# Patient Record
Sex: Female | Born: 1995 | Race: White | Hispanic: No | Marital: Single | Smoking: Former smoker
Health system: Southern US, Community
[De-identification: ages and names within clinical notes are randomized; demographics above are authoritative.]

## PROBLEM LIST (undated history)

## (undated) DIAGNOSIS — T7840XA Allergy, unspecified, initial encounter: Secondary | ICD-10-CM

## (undated) DIAGNOSIS — F419 Anxiety disorder, unspecified: Secondary | ICD-10-CM

## (undated) DIAGNOSIS — L0591 Pilonidal cyst without abscess: Principal | ICD-10-CM

## (undated) DIAGNOSIS — R51 Headache: Secondary | ICD-10-CM

## (undated) HISTORY — DX: Allergy, unspecified, initial encounter: T78.40XA

## (undated) HISTORY — DX: Pilonidal cyst without abscess: L05.91

## (undated) HISTORY — DX: Anxiety disorder, unspecified: F41.9

## (undated) HISTORY — DX: Headache: R51

---
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
## 2014-02-14 ENCOUNTER — Emergency Department: Payer: Self-pay | Admitting: Emergency Medicine

## 2014-02-14 LAB — URINALYSIS, COMPLETE
BILIRUBIN, UR: NEGATIVE
Glucose,UR: NEGATIVE mg/dL (ref 0–75)
KETONE: NEGATIVE
LEUKOCYTE ESTERASE: NEGATIVE
Nitrite: NEGATIVE
PH: 5 (ref 4.5–8.0)
Specific Gravity: 1.03 (ref 1.003–1.030)
WBC UR: <1 /HPF (ref 0–5)

## 2014-02-14 LAB — GC/CHLAMYDIA PROBE AMP

## 2014-02-14 LAB — WET PREP, GENITAL

## 2014-05-02 ENCOUNTER — Ambulatory Visit: Payer: Self-pay | Admitting: Physician Assistant

## 2014-05-02 LAB — URINALYSIS, COMPLETE
BILIRUBIN, UR: NEGATIVE
GLUCOSE, UR: NEGATIVE
Ketone: NEGATIVE
LEUKOCYTE ESTERASE: NEGATIVE
NITRITE: NEGATIVE
PH: 5.5 (ref 5.0–8.0)
PROTEIN: NEGATIVE
Specific Gravity: >=1.03 (ref 1.000–1.030)
WBC UR: NONE SEEN /HPF (ref 0–5)

## 2014-05-02 LAB — PREGNANCY, URINE: Pregnancy Test, Urine: NEGATIVE m[IU]/mL

## 2015-02-07 ENCOUNTER — Encounter: Payer: Self-pay | Admitting: Family Medicine

## 2015-02-07 ENCOUNTER — Ambulatory Visit (INDEPENDENT_AMBULATORY_CARE_PROVIDER_SITE_OTHER): Payer: BLUE CROSS/BLUE SHIELD | Admitting: Family Medicine

## 2015-02-07 VITALS — BP 116/62 | HR 105 | Temp 98.1°F | Resp 18 | Ht 64.0 in | Wt 142.3 lb

## 2015-02-07 DIAGNOSIS — N911 Secondary amenorrhea: Secondary | ICD-10-CM | POA: Diagnosis not present

## 2015-02-07 DIAGNOSIS — L308 Other specified dermatitis: Secondary | ICD-10-CM | POA: Insufficient documentation

## 2015-02-07 DIAGNOSIS — Z87442 Personal history of urinary calculi: Secondary | ICD-10-CM | POA: Insufficient documentation

## 2015-02-07 DIAGNOSIS — Z3201 Encounter for pregnancy test, result positive: Secondary | ICD-10-CM

## 2015-02-07 DIAGNOSIS — Z3041 Encounter for surveillance of contraceptive pills: Secondary | ICD-10-CM | POA: Diagnosis not present

## 2015-02-07 DIAGNOSIS — Z8669 Personal history of other diseases of the nervous system and sense organs: Secondary | ICD-10-CM | POA: Insufficient documentation

## 2015-02-07 DIAGNOSIS — N920 Excessive and frequent menstruation with regular cycle: Secondary | ICD-10-CM | POA: Insufficient documentation

## 2015-02-07 DIAGNOSIS — R4586 Emotional lability: Secondary | ICD-10-CM | POA: Insufficient documentation

## 2015-02-07 NOTE — Progress Notes (Signed)
Name: Emma Edwards   MRN: [id number]    DOB: 15-Aug-1996   Date:02/07/2015       Progress Note  Subjective  Chief Complaint  Chief Complaint  Patient presents with  . Contraception    patient has had irregular periods since starting a new birth control.    HPI  Emma Edwards is a pleasant 19yo female with a know history of heavy menses with painful cycles. Started on OCPs 05/2014 which the brand was switched around recently. LMP 01/06/15. Is currently sexually active. No abnormal pelvic pain or spotting today. No vaginal discharge.   Patient Active Problem List   Diagnosis Date Noted  . 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 Surveillance for birth control, oral contraceptives 02/07/2015  . History of kidney stones 02/07/2015  . History of migraine headaches 02/07/2015  . Menorrhagia with regular cycle 02/07/2015  . Anxiety and depression 02/07/2015    History  Substance Use Topics  . Smoking status: Current Every Day Smoker -- 1.00 packs/day for 2 years  . Smokeless tobacco: Never Used  . Alcohol Use: No     Current outpatient prescriptions:  .  Prenat w/o A-FeCb-FeGl-DSS-FA ([facility] RX PO), Take by mouth., Disp: , Rfl:  .  propranolol (INDERAL) 10 MG tablet, Take 10 mg by mouth 3 (three) times daily., Disp: , Rfl:  .  SUMAtriptan (IMITREX) 25 MG tablet, Take 25 mg by mouth as needed for migraine., Disp: , Rfl:   History reviewed. No pertinent past surgical history.  Family History  Problem Relation Age of Onset  . Alcohol abuse Mother   . Depression Mother   . Drug abuse Mother   . Mental illness Mother   . Alcohol abuse Father   . Arthritis Father   . Depression Father   . Drug abuse Father   . Hypertension Father   . Hypertension Paternal Uncle   . Alcohol abuse Maternal Grandmother   . Depression Maternal Grandmother   . Drug abuse Maternal Grandmother   . Mental illness Maternal Grandmother   . Alcohol abuse Maternal Grandfather   . Drug abuse Maternal  Grandfather   . Arthritis Paternal Grandmother   . Asthma Paternal Grandmother   . Cancer Paternal Grandmother   . COPD Paternal Grandmother   . Hyperlipidemia Paternal Grandmother     Allergies  Allergen Reactions  . Penicillins Hives     Review of Systems  CONSTITUTIONAL: No significant weight changes, fever, chills, weakness or fatigue.  HEENT:  - Eyes: No visual changes.  - Ears: No auditory changes. No pain.  - Nose: No sneezing, congestion, runny nose. - Throat: No sore throat. No changes in swallowing. SKIN: No rash or itching.  CARDIOVASCULAR: No chest pain, chest pressure or chest discomfort. No palpitations or edema.  RESPIRATORY: No shortness of breath, cough or sputum.  GASTROINTESTINAL: No anorexia, nausea, vomiting. No changes in bowel habits. No abdominal pain or blood.  GENITOURINARY: No dysuria. No frequency. No discharge. Having irregular menses. NEUROLOGICAL: No headache, dizziness, syncope, paralysis, ataxia, numbness or tingling in the extremities. No memory changes. No change in bowel or bladder control.  MUSCULOSKELETAL: No joint pain. No muscle pain. HEMATOLOGIC: No anemia, bleeding or bruising.  LYMPHATICS: No enlarged lymph nodes.  PSYCHIATRIC: No change in mood. No change in sleep pattern.  ENDOCRINOLOGIC: No reports of sweating, cold or heat intolerance. No polyuria or polydipsia.       Objective  BP 116/62 mmHg  Pulse 105  Temp(Src) 98.1 F (36.7 C) (  Oral)  Resp 18  Ht  (1.626 m)  Wt 142 lb 4.8 oz (64.547 kg)  BMI 24.41 kg/m2  SpO2 98%  LMP 01/07/2015 (Approximate)  Physical Exam  Constitutional: Patient appears well-developed and well-nourished. In no distress.  HEENT:  - Head: Normocephalic and atraumatic.  - Ears: Bilateral TMs gray, no erythema or effusion - Nose: Nasal mucosa moist - Mouth/Throat: Oropharynx is clear and moist. No tonsillar hypertrophy or erythema. No post nasal drainage.  - Eyes: Conjunctivae clear,  EOM movements normal. PERRLA. No scleral icterus.  Neck: Normal range of motion. Neck supple. No JVD present. No thyromegaly present.  Cardiovascular: Normal rate, regular rhythm and normal heart sounds.  No murmur heard.  Pulmonary/Chest: Effort normal and breath sounds normal. No respiratory distress. Musculoskeletal: Normal range of motion bilateral UE and LE, no joint effusions. Peripheral vascular: Bilateral LE no edema. Neurological: CN II-XII grossly intact with no focal deficits. Alert and oriented to person, place, and time. Coordination, balance, strength, speech and gait are normal.  Skin: Skin is warm and dry. Scattered cystic acne on shoulders chest and face. 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 Psychiatric: Patient has a normal mood and affect. Behavior is normal in office today. Judgment and thought content normal in office today.   Assessment & Plan  1. Surveillance for birth control, oral contraceptives If pregnancy not confirmed on blood work she is interested in trying Ortho-Tri Cyclen again which has worked well for her in the past.  2. Positive urine pregnancy test Get serum BHCG quant. Instructed patient to avoid NSAIDs and start PNV with Folate.  - B-HCG Quant  3. Secondary amenorrhea  - POCT urine pregnancy - B-HCG Quant

## 2015-02-08 LAB — BETA HCG QUANT (REF LAB): hCG Quant: 111 m[IU]/mL

## 2015-02-12 ENCOUNTER — Ambulatory Visit (INDEPENDENT_AMBULATORY_CARE_PROVIDER_SITE_OTHER): Payer: BLUE CROSS/BLUE SHIELD | Admitting: Family Medicine

## 2015-02-12 ENCOUNTER — Emergency Department
Admission: EM | Admit: 2015-02-12 | Discharge: 2015-02-13 | Disposition: A | Discharge status: Home / Self Care | Payer: BLUE CROSS/BLUE SHIELD | Attending: Emergency Medicine | Admitting: Emergency Medicine

## 2015-02-12 ENCOUNTER — Encounter: Payer: Self-pay | Admitting: Urgent Care

## 2015-02-12 ENCOUNTER — Encounter: Payer: Self-pay | Admitting: Family Medicine

## 2015-02-12 ENCOUNTER — Ambulatory Visit
Admission: RE | Admit: 2015-02-12 | Discharge: 2015-02-12 | Disposition: A | Discharge status: Home / Self Care | Payer: BLUE CROSS/BLUE SHIELD | Source: Ambulatory Visit | Attending: Family Medicine | Admitting: Family Medicine

## 2015-02-12 ENCOUNTER — Other Ambulatory Visit: Payer: Self-pay | Admitting: Family Medicine

## 2015-02-12 VITALS — BP 112/76 | HR 95 | Temp 98.0°F | Resp 16 | Ht 64.0 in | Wt 141.3 lb

## 2015-02-12 DIAGNOSIS — O209 Hemorrhage in early pregnancy, unspecified: Secondary | ICD-10-CM

## 2015-02-12 DIAGNOSIS — O200 Threatened abortion: Secondary | ICD-10-CM | POA: Diagnosis not present

## 2015-02-12 DIAGNOSIS — Z3A01 Less than 8 weeks gestation of pregnancy: Secondary | ICD-10-CM | POA: Insufficient documentation

## 2015-02-12 DIAGNOSIS — Z87891 Personal history of nicotine dependence: Secondary | ICD-10-CM | POA: Diagnosis not present

## 2015-02-12 DIAGNOSIS — Z79899 Other long term (current) drug therapy: Secondary | ICD-10-CM | POA: Diagnosis not present

## 2015-02-12 DIAGNOSIS — O4691 Antepartum hemorrhage, unspecified, first trimester: Secondary | ICD-10-CM | POA: Diagnosis not present

## 2015-02-12 DIAGNOSIS — Z880 Allergy status to penicillin: Secondary | ICD-10-CM | POA: Diagnosis not present

## 2015-02-12 LAB — HCG, QUANTITATIVE, PREGNANCY: HCG, BETA CHAIN, QUANT, S: 21 m[IU]/mL — AB (ref <5)

## 2015-02-12 NOTE — ED Notes (Signed)
Patient presents with c/o moderate to heavy vaginal bleeding since 0800 - reports that she has used 6-8 pads; 2 in the last hour. Of note, patient is reports that is a 4 week primipara.

## 2015-02-12 NOTE — Progress Notes (Deleted)
Name: Emma Edwards   MRN: [id number]    DOB: 01/14/1996   Date:02/12/2015       Progress Note  Subjective  Chief Complaint  No chief complaint on file.   HPI  Emma Edwards is a pleasant 18yo female G1P0 pregnancy at about 4-5weeks based on serum BHCG levels done last week who presents today with vaginal bleeding.   Patient Active Problem List   Diagnosis Date Noted  . 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 Surveillance for birth control, oral contraceptives 02/07/2015  . History of kidney stones 02/07/2015  . History of migraine headaches 02/07/2015  . Menorrhagia with regular cycle 02/07/2015  . Anxiety and depression 02/07/2015    History  Substance Use Topics  . Smoking status: Current Every Day Smoker -- 1.00 packs/day for 2 years  . Smokeless tobacco: Never Used  . Alcohol Use: No     Current outpatient prescriptions:  .  Prenat w/o A-FeCb-FeGl-DSS-FA ([facility] RX PO), Take by mouth., Disp: , Rfl:  .  propranolol (INDERAL) 10 MG tablet, Take 10 mg by mouth 3 (three) times daily., Disp: , Rfl:  .  SUMAtriptan (IMITREX) 25 MG tablet, Take 25 mg by mouth as needed for migraine., Disp: , Rfl:   Allergies  Allergen Reactions  . Penicillins Hives    Review of Systems  Ten systems reviewed and is negative except as mentioned in HPI.

## 2015-02-12 NOTE — Patient Instructions (Signed)
Vaginal Bleeding During Pregnancy, First Trimester  A small amount of bleeding (spotting) from the vagina is relatively common in early pregnancy. It usually stops on its own. Various things may cause bleeding or spotting in early pregnancy. Some bleeding may be related to the pregnancy, and some may not. In most cases, the bleeding is normal and is not a problem. However, bleeding can also be a sign of something serious. Be sure to tell your health care provider about any vaginal bleeding right away.  Some possible causes of vaginal bleeding during the first trimester include:  · Infection or inflammation of the cervix.  · Growths (polyps) on the cervix.  · Miscarriage or threatened miscarriage.  · Pregnancy tissue has developed outside of the uterus and in a fallopian tube (tubal pregnancy).  · Tiny cysts have developed in the uterus instead of pregnancy tissue (molar pregnancy).  HOME CARE INSTRUCTIONS   Watch your condition for any changes. The following actions may help to lessen any discomfort you are feeling:  · Follow your health care provider's instructions for limiting your activity. If your health care provider orders bed rest, you may need to stay in bed and only get up to use the bathroom. However, your health care provider may allow you to continue light activity.  · If needed, make plans for someone to help with your regular activities and responsibilities while you are on bed rest.  · Keep track of the number of pads you use each day, how often you change pads, and how soaked (saturated) they are. Write this down.  · Do not use tampons. Do not douche.  · Do not have sexual intercourse or orgasms until approved by your health care provider.  · If you pass any tissue from your vagina, save the tissue so you can show it to your health care provider.  · Only take over-the-counter or prescription medicines as directed by your health care provider.  · Do not take aspirin because it can make you  bleed.  · Keep all follow-up appointments as directed by your health care provider.  SEEK MEDICAL CARE IF:  · You have any vaginal bleeding during any part of your pregnancy.  · You have cramps or labor pains.  · You have a fever, not controlled by medicine.  SEEK IMMEDIATE MEDICAL CARE IF:   · You have severe cramps in your back or belly (abdomen).  · You pass large clots or tissue from your vagina.  · Your bleeding increases.  · You feel light-headed or weak, or you have fainting episodes.  · You have chills.  · You are leaking fluid or have a gush of fluid from your vagina.  · You pass out while having a bowel movement.  MAKE SURE YOU:  · Understand these instructions.  · Will watch your condition.  · Will get help right away if you are not doing well or get worse.  Document Released: 05/20/2005 Document Revised: 08/15/2013 Document Reviewed: 04/17/2013  [employer]® Patient Information ©2015 [employer]. This information is not intended to replace advice given to you by your health care provider. Make sure you discuss any questions you have with your health care provider.

## 2015-02-12 NOTE — ED Provider Notes (Signed)
[facility] Emergency Department Provider Note  ____________________________________________  Time seen: Approximately 11:26 PM  I have reviewed the triage vital signs and the nursing notes.   HISTORY  Chief Complaint Vaginal Bleeding    HPI Emma Edwards is a 19 y.o. female who presents with vaginal bleeding since 8 AM. Patient is a G1 P0 approximately [redacted] weeks pregnant who was seen by her PCP this afternoon for vaginal bleeding. Patient had a beta hCG and transvaginal ultrasound performed. Last sexual intercourse the previous evening. Patient awoke this morning to find vaginal spotting which became moderate bleeding by this evening. Bleeding is associated with 6/10 pelvic cramping. Patient denies feeling lightheaded, chest pain, shortness of breath, or vomiting.   Past Medical History  Diagnosis Date  . Pilonidal cyst 2014  . Headache(784.0) 2013  . Allergy   . Anxiety   No history of ovarian cysts or endometriosis  Patient Active Problem List   Diagnosis Date Noted  . 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 Surveillance for birth control, oral contraceptives 02/07/2015  . History of kidney stones 02/07/2015  . History of migraine headaches 02/07/2015  . Menorrhagia with regular cycle 02/07/2015  . Anxiety and depression 02/07/2015    History reviewed. No pertinent past surgical history.  Current Outpatient Rx  Name  Route  Sig  Dispense  Refill  . Prenat w/o A-FeCb-FeGl-DSS-FA ([facility] RX PO)   Oral   Take by mouth.         . propranolol (INDERAL) 10 MG tablet   Oral   Take 10 mg by mouth 3 (three) times daily.         . SUMAtriptan (IMITREX) 25 MG tablet   Oral   Take 25 mg by mouth as needed for migraine.           Allergies Penicillins  Family History  Problem Relation Age of Onset  . Alcohol abuse Mother   . Depression Mother   . Drug abuse Mother   . Mental illness Mother   . Alcohol abuse Father   . Arthritis Father    . Depression Father   . Drug abuse Father   . Hypertension Father   . Hypertension Paternal Uncle   . Alcohol abuse Maternal Grandmother   . Depression Maternal Grandmother   . Drug abuse Maternal Grandmother   . Mental illness Maternal Grandmother   . Alcohol abuse Maternal Grandfather   . Drug abuse Maternal Grandfather   . Arthritis Paternal Grandmother   . Asthma Paternal Grandmother   . Cancer Paternal Grandmother   . COPD Paternal Grandmother   . Hyperlipidemia Paternal Grandmother     Social History History  Substance Use Topics  . Smoking status: Former Smoker -- 1.00 packs/day for 2 years  . Smokeless tobacco: Never Used  . Alcohol Use: No    Review of Systems Constitutional: No fever/chills Eyes: No visual changes. ENT: No sore throat. Cardiovascular: Denies chest pain. Respiratory: Denies shortness of breath. Gastrointestinal: No abdominal pain.  No nausea, no vomiting.  No diarrhea.  No constipation. Genitourinary: Positive for vaginal bleeding. Negative for dysuria. Musculoskeletal: Negative for back pain. Skin: Negative for rash. Neurological: Negative for headaches, focal weakness or numbness.  10-point ROS otherwise negative.  ____________________________________________   PHYSICAL EXAM:  VITAL SIGNS: ED Triage Vitals  Enc Vitals Group     BP 02/12/15 2240 127/87 mmHg     Pulse Rate 02/12/15 2240 80     Resp 02/12/15 2240 14  Temp 02/12/15 2240 98.5 F (36.9 C)     Temp Source 02/12/15 2240 Oral     SpO2 02/12/15 2240 98 %     Weight 02/12/15 2240 142 lb (64.411 kg)     Height 02/12/15 2240 5\' 4"  (1.626 m)     Head Cir --      Peak Flow --      Pain Score 02/12/15 2241 5     Pain Loc --      Pain Edu? --      Excl. in GC? --     Constitutional: Alert and oriented. Well appearing and in no acute distress. Eyes: Conjunctivae are normal. PERRL. EOMI. Head: Atraumatic. Nose: No congestion/rhinnorhea. Mouth/Throat: Mucous membranes  are moist.  Oropharynx non-erythematous. Neck: No stridor.   Cardiovascular: Normal rate, regular rhythm. Grossly normal heart sounds.  Good peripheral circulation. Respiratory: Normal respiratory effort.  No retractions. Lungs CTAB. Gastrointestinal: Soft and nontender. No distention. No abdominal bruits. No CVA tenderness. Pelvic: External exam within normal limits and free of rash or lesions. Speculum exam reveals mild to moderate vaginal bleeding with closed cervical os. Bimanual exam within normal limits. Musculoskeletal: No lower extremity tenderness nor edema.  No joint effusions. Neurologic:  Normal speech and language. No gross focal neurologic deficits are appreciated. Speech is normal. No gait instability. Skin:  Skin is warm, dry and intact. No rash noted. Psychiatric: Mood and affect are normal. Speech and behavior are normal.  ____________________________________________   LABS (all labs ordered are listed, but only abnormal results are displayed)  Labs Reviewed  CBC WITH DIFFERENTIAL/PLATELET - Abnormal; Notable for the following:    Lymphs Abs 4.7 (*)    All other components within normal limits  ABO/RH  ABO/RH   ____________________________________________  EKG  None ____________________________________________  RADIOLOGY  Pelvic ultrasound less than 14 weeks with transvaginal (viewed by me, interpreted by Dr. Earlene Plater): No intrauterine gestational sac identified. In the setting of positive pregnancy test and no definite intrauterine pregnancy, this reflects a pregnancy of unknown location. Differential considerations include early normal IUP, abnormal IUP, or nonvisualized ectopic pregnancy. Differentiation is achieved with serial beta HCG supplemented by repeat sonography as clinically warranted. ____________________________________________   PROCEDURES  Procedure(s) performed: None  Critical Care performed:  No  ____________________________________________   INITIAL IMPRESSION / ASSESSMENT AND PLAN / ED COURSE  Pertinent labs & imaging results that were available during my care of the patient were reviewed by me and considered in my medical decision making (see chart for details).  19 year old G1 P0 approximately [redacted] weeks pregnant who presents with moderate vaginal bleeding. Beta hCG has dropped from 111 on June 16 to 21 on labs done this afternoon. Patient does not know her blood type. We will check a CBC and Rh; will attempt to find ultrasound results from this afternoon.  ----------------------------------------- 1:22 AM on 02/13/2015 -----------------------------------------  Discussed with patient results of ultrasound. Patient will follow-up with her doctor this week. Strict return precautions given. Patient verbalizes understanding and agrees with plan of care. ____________________________________________   FINAL CLINICAL IMPRESSION(S) / ED DIAGNOSES  Final diagnoses:  Threatened miscarriage in early pregnancy      Irean Hong, MD 02/13/15 [phone]

## 2015-02-12 NOTE — Progress Notes (Deleted)
Name: Emma Edwards   MRN: [id number]    DOB: 05/30/1996   Date:02/12/2015       Progress Note  Subjective  Chief Complaint  No chief complaint on file.   HPI  Emma Edwards is a pleasant 18yo female G1P0 pregnancy at about 4-5weeks based on serum BHCG levels done last week who presents today with vaginal bleeding.   Patient Active Problem List   Diagnosis Date Noted  . 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 Surveillance for birth control, oral contraceptives 02/07/2015  . History of kidney stones 02/07/2015  . History of migraine headaches 02/07/2015  . Menorrhagia with regular cycle 02/07/2015  . Anxiety and depression 02/07/2015    History  Substance Use Topics  . Smoking status: Current Every Day Smoker -- 1.00 packs/day for 2 years  . Smokeless tobacco: Never Used  . Alcohol Use: No     Current outpatient prescriptions:  .  Prenat w/o A-FeCb-FeGl-DSS-FA ([facility] RX PO), Take by mouth., Disp: , Rfl:  .  propranolol (INDERAL) 10 MG tablet, Take 10 mg by mouth 3 (three) times daily., Disp: , Rfl:  .  SUMAtriptan (IMITREX) 25 MG tablet, Take 25 mg by mouth as needed for migraine., Disp: , Rfl:   Allergies  Allergen Reactions  . Penicillins Hives    Review of Systems  Ten systems reviewed and is negative except as mentioned in HPI.     

## 2015-02-12 NOTE — Progress Notes (Signed)
Name: Emma Edwards   MRN: [id number]    DOB: [birth year]   Date:02/12/2015       Progress Note  Subjective  Chief Complaint  Chief Complaint  Patient presents with  . Vaginal Bleeding    Pt is [redacted]wks pregnant old blood as of today blood is bright red    HPI  Emma Edwards is a pleasant 19yo female G1P0 pregnancy at about 4-5weeks based on serum BHCG levels done last week who presents today with vaginal bleeding. No tissue or blood clots passed. Some pelvic pain but not intolerable. No fevers, chills, nausea.    Patient Active Problem List   Diagnosis Date Noted  . 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 Surveillance for birth control, oral contraceptives 02/07/2015  . History of kidney stones 02/07/2015  . History of migraine headaches 02/07/2015  . Menorrhagia with regular cycle 02/07/2015  . Anxiety and depression 02/07/2015    History  Substance Use Topics  . Smoking status: Former Smoker -- 1.00 packs/day for 2 years  . Smokeless tobacco: Never Used  . Alcohol Use: No     Current outpatient prescriptions:  .  Prenat w/o A-FeCb-FeGl-DSS-FA ([facility]I RX PO), Take by mouth., Disp: , Rfl:  .  propranolol (INDERAL) 10 MG tablet, Take 10 mg by mouth 3 (three) times daily., Disp: , Rfl:  .  SUMAtriptan (IMITREX) 25 MG tablet, Take 25 mg by mouth as needed for migraine., Disp: , Rfl:   Allergies  Allergen Reactions  . Penicillins Hives    Review of Systems  Ten systems reviewed and is negative except as mentioned in HPI.    Objective  BP 112/76 mmHg  Pulse 95  Temp(Src) 98 F (36.7 C)  Resp 16  Ht 5\' 4"  (1.626 m)  Wt 141 lb 4.8 oz (64.093 kg)  BMI 24.24 kg/m2  SpO2 97%  LMP 01/07/2015 (Approximate)  Physical Exam  Constitutional: Patient appears well-developed and well-nourished. In no distress.  Cardiovascular: Normal rate, regular rhythm and normal heart sounds.  No murmur heard.  Pulmonary/Chest: Effort normal and breath sounds normal. No respiratory  distress. Abdominal: Soft. Bowel sounds are normal, no distension. There is no tenderness. no masses FEMALE GENITALIA:  External genitalia normal External urethra normal Vaginal vault normal with fresh dark red blood, no tissue or clotts. Cervical os open Bimanual exam not done Peripheral vascular: Bilateral LE no edema. Skin: Skin is warm and dry. No rash noted. No erythema.  Psychiatric: Patient is anxious. Behavior is normal in office today. Judgment and thought content normal in office today.   Recent Results (from the past 2160 hour(s))  Beta HCG, Quant (tumor marker)     Status: None   Collection Time: 02/07/15  3:12 PM  Result Value Ref Range   hCG Quant 111 mIU/mL    Comment:                      Female (Non-pregnant)    0 -     5                             (Postmenopausal)  0 -     8                      Female (Pregnant)                      Weeks of Gestation  3                6 -    71                              4               10 -   750                              5              217 -  7138                              6              158 - 31795                              7             3697 -163563                              8            32065 -149571                              9            63803 -151410                             10            46509 -186977                             12            27832 -210612                             14            13950 - 62530                             15            12039 - 70971                             16             9040 - 56451                             17             8175 - 55868                             18             8099 - 58176 [employer] E CLIA methodology  Assessment & Plan  1. Vaginal bleeding in pregnancy, first trimester Threatened abortion discussed with patient. She voices understanding what is discussed today. I will have her repeat serum Bhcg and OB  ultrasound today.  - hCG, quantitative, pregnancy - [place] OP OB Comp Less 14 Wks; Future - US OB Transvaginal; Future

## 2015-02-13 ENCOUNTER — Telehealth: Payer: Self-pay | Admitting: Family Medicine

## 2015-02-13 DIAGNOSIS — O200 Threatened abortion: Secondary | ICD-10-CM | POA: Insufficient documentation

## 2015-02-13 LAB — CBC WITH DIFFERENTIAL/PLATELET
Basophils Absolute: 0 10*3/uL (ref 0–0.1)
Basophils Relative: 0 %
EOS PCT: 1 %
Eosinophils Absolute: 0.1 10*3/uL (ref 0–0.7)
HCT: 44.1 % (ref 35.0–47.0)
Hemoglobin: 15 g/dL (ref 12.0–16.0)
LYMPHS ABS: 4.7 10*3/uL — AB (ref 1.0–3.6)
LYMPHS PCT: 47 %
MCH: 31.7 pg (ref 26.0–34.0)
MCHC: 34 g/dL (ref 32.0–36.0)
MCV: 93.3 fL (ref 80.0–100.0)
Monocytes Absolute: 0.7 10*3/uL (ref 0.2–0.9)
Monocytes Relative: 6 %
NEUTROS ABS: 4.8 10*3/uL (ref 1.4–6.5)
NEUTROS PCT: 46 %
PLATELETS: 161 10*3/uL (ref 150–440)
RBC: 4.73 MIL/uL (ref 3.80–5.20)
RDW: 12.9 % (ref 11.5–14.5)
WBC: 10.3 10*3/uL (ref 3.6–11.0)

## 2015-02-13 LAB — ABO/RH
ABO/RH(D): A POS
ABO/RH(D): A POS

## 2015-02-13 NOTE — ED Notes (Signed)
Pt is awaiting lab results

## 2015-02-13 NOTE — Discharge Instructions (Signed)
1. Drink plenty of fluids daily. 2. Pelvic rest until seen by your doctor - no sexual intercourse, douche, tampons. 3. Return to the ER for worsening symptoms, persistent vomiting, fainting, soaking more than 1 pad per hour or other concerns.  Threatened Miscarriage A threatened miscarriage occurs when you have vaginal bleeding during your first 20 weeks of pregnancy but the pregnancy has not ended. If you have vaginal bleeding during this time, your health care provider will do tests to make sure you are still pregnant. If the tests show you are still pregnant and the developing baby (fetus) inside your womb (uterus) is still growing, your condition is considered a threatened miscarriage. A threatened miscarriage does not mean your pregnancy will end, but it does increase the risk of losing your pregnancy (complete miscarriage). CAUSES  The cause of a threatened miscarriage is usually not known. If you go on to have a complete miscarriage, the most common cause is an abnormal number of chromosomes in the developing baby. Chromosomes are the structures inside cells that hold all your genetic material. Some causes of vaginal bleeding that do not result in miscarriage include:  Having sex.  Having an infection.  Normal hormone changes of pregnancy.  Bleeding that occurs when an egg implants in your uterus. RISK FACTORS Risk factors for bleeding in early pregnancy include:  Obesity.  Smoking.  Drinking excessive amounts of alcohol or caffeine.  Recreational drug use. SIGNS AND SYMPTOMS  Light vaginal bleeding.  Mild abdominal pain or cramps. DIAGNOSIS  If you have bleeding with or without abdominal pain before 20 weeks of pregnancy, your health care provider will do tests to check whether you are still pregnant. One important test involves using sound waves and a computer (ultrasound) to create images of the inside of your uterus. Other tests include an internal exam of your vagina and  uterus (pelvic exam) and measurement of your baby's heart rate.  You may be diagnosed with a threatened miscarriage if:  Ultrasound testing shows you are still pregnant.  Your baby's heart rate is strong.  A pelvic exam shows that the opening between your uterus and your vagina (cervix) is closed.  Your heart rate and blood pressure are stable.  Blood tests confirm you are still pregnant. TREATMENT  No treatments have been shown to prevent a threatened miscarriage from going on to a complete miscarriage. However, the right home care is important.  HOME CARE INSTRUCTIONS   Make sure you keep all your appointments for prenatal care. This is very important.  Get plenty of rest.  Do not have sex or use tampons if you have vaginal bleeding.  Do not douche.  Do not smoke or use recreational drugs.  Do not drink alcohol.  Avoid caffeine. SEEK MEDICAL CARE IF:  You have light vaginal bleeding or spotting while pregnant.  You have abdominal pain or cramping.  You have a fever. SEEK IMMEDIATE MEDICAL CARE IF:  You have heavy vaginal bleeding.  You have blood clots coming from your vagina.  You have severe low back pain or abdominal cramps.  You have fever, chills, and severe abdominal pain. MAKE SURE YOU:  Understand these instructions.  Will watch your condition.  Will get help right away if you are not doing well or get worse. Document Released: 08/10/2005 Document Revised: 08/15/2013 Document Reviewed: 06/06/2013 [facility] Patient Information 2015 [place], [place]. This information is not intended to replace advice given to you by your health care provider. Make sure you discuss  any questions you have with your health care provider. ° °

## 2015-02-13 NOTE — Telephone Encounter (Signed)
Patient returned my call and stated that she was already informed about her results and what she is to expect. She stated that she proceeded to the ER after she did not receive a call from Dr. Sherley Bounds as she was told by the CT tech yesterday. I apologized to the patient for the misunderstanding and explained to her that we did not get the results until this morning. She stated ok, so I then discuss the instructions of Dr. Sherley Bounds and told her to follow up in one week.   Patient later called wanting to know when she could re-start the birth control. After consulting with Dr. Sherley Bounds, patient was informed that after she has completely stopped bleeding to call us to get an order for lab work then she will follow up with [place] here in the clinic and at that time new birth control options will be discussed. Patient agreed and said thanks.

## 2015-02-13 NOTE — Telephone Encounter (Signed)
I tried to contact this patient to review her labs and ultrasound results, but there was no answer. A message was left for this patient to give [place] a call back as soon as possible.

## 2015-02-13 NOTE — Telephone Encounter (Signed)
Please let Emma Edwards know:  Unfortunately her serum Bhcg dropped from 111 to 22 suggesting spontaneous threatened abortion. Ultrasound of pelvic area could not identify a fetal pole. Continue to monitor symptoms, blood work done at ER later in the evening 02/12/15 showed no concern for anemia. Take ibuprofen for pain. Follow up 1 week to recheck bleeding and serum Bhcg levels, proceed to ER if pelvic pain worsens as an ectopic pregnancy outside of the uterus is an emergency.

## 2015-02-20 ENCOUNTER — Ambulatory Visit: Payer: BLUE CROSS/BLUE SHIELD | Admitting: Family Medicine

## 2015-02-22 ENCOUNTER — Telehealth: Payer: Self-pay

## 2015-02-22 ENCOUNTER — Other Ambulatory Visit: Payer: Self-pay | Admitting: Family Medicine

## 2015-02-22 ENCOUNTER — Ambulatory Visit (INDEPENDENT_AMBULATORY_CARE_PROVIDER_SITE_OTHER): Payer: BLUE CROSS/BLUE SHIELD | Admitting: Family Medicine

## 2015-02-22 ENCOUNTER — Encounter: Payer: Self-pay | Admitting: Family Medicine

## 2015-02-22 VITALS — BP 110/72 | HR 95 | Temp 97.8°F | Resp 16 | Ht 64.0 in | Wt 139.6 lb

## 2015-02-22 DIAGNOSIS — O200 Threatened abortion: Secondary | ICD-10-CM | POA: Diagnosis not present

## 2015-02-22 DIAGNOSIS — Z3041 Encounter for surveillance of contraceptive pills: Secondary | ICD-10-CM | POA: Diagnosis not present

## 2015-02-22 MED ORDER — NORGESTIM-ETH ESTRAD TRIPHASIC 0.18/0.215/0.25 MG-35 MCG PO TABS: 1.0000 | ORAL_TABLET | Freq: Every day | ORAL | Status: DC

## 2015-02-22 NOTE — Telephone Encounter (Signed)
Patient called stating that she had unprotected sex last night and wanted to know what the fertility rate is after a miscarriage. I told her that I would consult with Dr. Sherley BoundsSundaram and will call her back.

## 2015-02-22 NOTE — Patient Instructions (Signed)
Oral Contraception Use Oral contraceptive pills (OCPs) are medicines taken to prevent pregnancy. OCPs work by preventing the ovaries from releasing eggs. The hormones in OCPs also cause the cervical mucus to thicken, preventing the sperm from entering the uterus. The hormones also cause the uterine lining to become thin, not allowing a fertilized egg to attach to the inside of the uterus. OCPs are highly effective when taken exactly as prescribed. However, OCPs do not prevent sexually transmitted diseases (STDs). Safe sex practices, such as using condoms along with an OCP, can help prevent STDs. Before taking OCPs, you may have a physical exam and Pap test. Your health care provider may also order blood tests if necessary. Your health care provider will make sure you are a good candidate for oral contraception. Discuss with your health care provider the possible side effects of the OCP you may be prescribed. When starting an OCP, it can take 2 to 3 months for the body to adjust to the changes in hormone levels in your body.  HOW TO TAKE ORAL CONTRACEPTIVE PILLS Your health care provider may advise you on how to start taking the first cycle of OCPs. Otherwise, you can:   Start on day 1 of your menstrual period. You will not need any backup contraceptive protection with this start time.   Start on the first Sunday after your menstrual period or the day you get your prescription. In these cases, you will need to use backup contraceptive protection for the first week.   Start the pill at any time of your cycle. If you take the pill within 5 days of the start of your period, you are protected against pregnancy right away. In this case, you will not need a backup form of birth control. If you start at any other time of your menstrual cycle, you will need to use another form of birth control for 7 days. If your OCP is the type called a minipill, it will protect you from pregnancy after taking it for 2 days (48  hours). After you have started taking OCPs:   If you forget to take 1 pill, take it as soon as you remember. Take the next pill at the regular time.   If you miss 2 or more pills, call your health care provider because different pills have different instructions for missed doses. Use backup birth control until your next menstrual period starts.   If you use a 28-day pack that contains inactive pills and you miss 1 of the last 7 pills (pills with no hormones), it will not matter. Throw away the rest of the non-hormone pills and start a new pill pack.  No matter which day you start the OCP, you will always start a new pack on that same day of the week. Have an extra pack of OCPs and a backup contraceptive method available in case you miss some pills or lose your OCP pack.  HOME CARE INSTRUCTIONS   Do not smoke.   Always use a condom to protect against STDs. OCPs do not protect against STDs.   Use a calendar to mark your menstrual period days.   Read the information and directions that came with your OCP. Talk to your health care provider if you have questions.  SEEK MEDICAL CARE IF:   You develop nausea and vomiting.   You have abnormal vaginal discharge or bleeding.   You develop a rash.   You miss your menstrual period.   You are losing   your hair.   You need treatment for mood swings or depression.   You get dizzy when taking the OCP.   You develop acne from taking the OCP.   You become pregnant.  SEEK IMMEDIATE MEDICAL CARE IF:   You develop chest pain.   You develop shortness of breath.   You have an uncontrolled or severe headache.   You develop numbness or slurred speech.   You develop visual problems.   You develop pain, redness, and swelling in the legs.  Document Released: 07/30/2011 Document Revised: 12/25/2013 Document Reviewed: 01/29/2013 [employer] Patient Information 2015 [employer]. This information is not intended to replace  advice given to you by your health care provider. Make sure you discuss any questions you have with your health care provider.  

## 2015-02-22 NOTE — Telephone Encounter (Signed)
Please let Maxine GlennMonica know that ovulation within a week of spontaneous abortion is very low likelihood but not impossible. The blood work that I ordered will give me more indication if she is pregnant or not and we will be in contact with her as soon as we get the results (keep in mind this is a long weekend).

## 2015-02-22 NOTE — Progress Notes (Signed)
Name: Emma Edwards   MRN: [id number]    DOB: 04/17/1996   Date:02/22/2015       Progress Note  Subjective  Chief Complaint  Chief Complaint  Patient presents with  . Contraception    patient stopped bleeding on Saturday and has had no discharge or anything since Monday.    HPI  Emma Edwards is a pleasant 18yo female G1P0010 with recent spontaneous abortion of pregnancy at about 5weeks based on serum BHCG levels done previously. Ultrasound could not identify viable pregnancy. She reports she has passed all products of conception spontaneously and is no longer having vaginal bleeding or pelvic pain. No fevers, chills, nausea. She is interested in returning to [facility] birth control. She previously had good results with Tri-Sprintec.   Patient Active Problem List   Diagnosis Date Noted  . Threatened abortion in first trimester 02/13/2015  . 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 Surveillance for birth control, oral contraceptives 02/07/2015  . History of kidney stones 02/07/2015  . History of migraine headaches 02/07/2015  . Menorrhagia with regular cycle 02/07/2015  . Anxiety and depression 02/07/2015    History  Substance Use Topics  . Smoking status: Former Smoker -- 1.00 packs/day for 2 years  . Smokeless tobacco: Never Used  . Alcohol Use: No     Current outpatient prescriptions:  .  Norgestimate-Ethinyl Estradiol Triphasic 0.18/0.215/0.25 MG-35 MCG tablet, Take 1 tablet by mouth daily., Disp: 1 Package, Rfl: 11 .  propranolol (INDERAL) 10 MG tablet, Take 10 mg by mouth 3 (three) times daily., Disp: , Rfl:  .  SUMAtriptan (IMITREX) 25 MG tablet, Take 25 mg by mouth as needed for migraine., Disp: , Rfl:   No past surgical history on file.  Family History  Problem Relation Age of Onset  . Alcohol abuse Mother   . Depression Mother   . Drug abuse Mother   . Mental illness Mother   . Alcohol abuse Father   . Arthritis Father   . Depression Father   . Drug abuse Father    . Hypertension Father   . Hypertension Paternal Uncle   . Alcohol abuse Maternal Grandmother   . Depression Maternal Grandmother   . Drug abuse Maternal Grandmother   . Mental illness Maternal Grandmother   . Alcohol abuse Maternal Grandfather   . Drug abuse Maternal Grandfather   . Arthritis Paternal Grandmother   . Asthma Paternal Grandmother   . Cancer Paternal Grandmother   . COPD Paternal Grandmother   . Hyperlipidemia Paternal Grandmother     Allergies  Allergen Reactions  . Penicillins Hives     Review of Systems  Ten systems reviewed and is negative except as mentioned in HPI.  Objective  BP 110/72 mmHg  Pulse 95  Temp(Src) 97.8 F (36.6 C) (Oral)  Resp 16  Ht  (1.626 m)  Wt 139 lb 9.6 oz (63.322 kg)  BMI 23.95 kg/m2  SpO2 98%  LMP 01/07/2015 (Approximate) Body mass index is 23.95 kg/(m^2).  Physical Exam  Constitutional: Patient appears well-developed and well-nourished. In no distress.  HEENT:  - Head: Normocephalic and atraumatic.  - Ears: Bilateral TMs gray, no erythema or effusion - Nose: Nasal mucosa moist - Mouth/Throat: Oropharynx is clear and moist. No tonsillar hypertrophy or erythema. No post nasal drainage.  - Eyes: Conjunctivae clear, EOM movements normal. PERRLA. No scleral icterus.  Neck: Normal range of motion. Neck supple. No JVD present. No thyromegaly present.  Cardiovascular: Normal rate, regular rhythm and normal  heart sounds.  No murmur heard.  Pulmonary/Chest: Effort normal and breath sounds normal. No respiratory distress. Musculoskeletal: Normal range of motion bilateral UE and LE, no joint effusions. Peripheral vascular: Bilateral LE no edema. Neurological: CN II-XII grossly intact with no focal deficits. Alert and oriented to person, place, and time. Coordination, balance, strength, speech and gait are normal.  Skin: Skin is warm and dry. No rash noted. Scattered cystic acne face, shoulders, back. Psoriasis raised pink  lesion on bilateral knees. Psychiatric: Patient has a normal mood and affect. Behavior is normal in office today. Judgment and thought content normal in office today.   Recent Results (from the past 2160 hour(s))  Beta HCG, Quant (tumor marker)     Status: None   Collection Time: 02/07/15  3:12 PM  Result Value Ref Range   hCG Quant 111 mIU/mL    Comment:                      Female (Non-pregnant)    0 -     5                             (Postmenopausal)  0 -     8                      Female (Pregnant)                      Weeks of Gestation                              3                6 -    71                              4               10 -   750                              5              217 -  7138                              6              158 - 31795                              7             [id number] -[id number]                              8            32065 -[id number]                              9            [phone]  10            [phone]                             12            [id number] -[phone]                             14            13950 - 62530                             15            12039 - 70971                             16             9040 - 56451                             17             [phone]                             18             8099 - [phone] Roche E CLIA methodology   hCG, quantitative, pregnancy     Status: Abnormal   Collection Time: 02/12/15  3:58 PM  Result Value Ref Range   hCG, Beta Chain, Quant, S 21 (H) <5 mIU/mL    Comment:          GEST. AGE      CONC.  (mIU/mL)   <=1 WEEK        5 - 50     2 WEEKS       50 - 500     3 WEEKS       100 - 10,000     4 WEEKS     1,000 - 30,000     5 WEEKS     3,500 - 115,000   6-8 WEEKS     12,000 - 270,000    12 WEEKS     15,000 - 220,000        FEMALE AND NON-PREGNANT FEMALE:     LESS THAN 5 mIU/mL   CBC with Differential     Status: Abnormal   Collection  Time: 02/13/15 12:28 AM  Result Value Ref Range   WBC 10.3 3.6 - 11.0 K/uL   RBC 4.73 3.80 - 5.20 MIL/uL   Hemoglobin 15.0 12.0 - 16.0 g/dL   HCT 30.844.1 65.735.0 - 84.647.0 %   MCV 93.3 80.0 - 100.0 fL   MCH 31.7 26.0 - 34.0 pg   MCHC 34.0 32.0 - 36.0 g/dL   RDW 96.212.9 95.211.5 - 84.114.5 %   Platelets 161 150 - 440 K/uL   Neutrophils Relative % 46 %   Neutro Abs 4.8 1.4 - 6.5 K/uL   Lymphocytes Relative 47 %   Lymphs Abs 4.7 (H) 1.0 - 3.6 K/uL   Monocytes Relative 6 %   Monocytes Absolute 0.7 0.2 - 0.9 K/uL   Eosinophils Relative 1 %   Eosinophils  Absolute 0.1 0 - 0.7 K/uL   Basophils Relative 0 %   Basophils Absolute 0.0 0 - 0.1 K/uL  ABO/Rh     Status: None   Collection Time: 02/13/15 12:28 AM  Result Value Ref Range   ABO/RH(D) A POS   ABO/Rh     Status: None   Collection Time: 02/13/15 12:31 AM  Result Value Ref Range   ABO/RH(D) A POS      Assessment & Plan  1. Threatened abortion in first trimester Likely completed spontaneous abortion with no complications at this time. Will check serum BHCG.  - B-HCG Quant  2. Surveillance for birth control, oral contraceptives Patient has been counseled on birth control options today. We discussed risks and benefits of each available contraception. Counseled on risk for STDs not reduced by birth control use. Decided on oral contraceptives. The patient has been counseled on the proper usage, side effects and potential interactions of the new medication.  - Norgestimate-Ethinyl Estradiol Triphasic 0.18/0.215/0.25 MG-35 MCG tablet; Take 1 tablet by mouth daily.  Dispense: 1 Package; Refill: 11

## 2015-02-22 NOTE — Telephone Encounter (Signed)
Patient was informed and said thanks for calling me back.

## 2015-02-23 LAB — BETA HCG QUANT (REF LAB)

## 2015-04-22 ENCOUNTER — Telehealth: Payer: Self-pay | Admitting: Family Medicine

## 2015-04-22 NOTE — Telephone Encounter (Signed)
Requesting return call. States that the pharmacy keeps giving her the generic brand of Tri-Sprintec however Dr Sherley Bounds said she was going to put her on the brand name.

## 2015-04-23 ENCOUNTER — Other Ambulatory Visit: Payer: Self-pay | Admitting: Family Medicine

## 2015-04-23 ENCOUNTER — Telehealth: Payer: Self-pay | Admitting: Family Medicine

## 2015-04-23 DIAGNOSIS — Z3041 Encounter for surveillance of contraceptive pills: Secondary | ICD-10-CM

## 2015-04-23 MED ORDER — TRI-SPRINTEC 0.18/0.215/0.25 MG-35 MCG PO TABS: 1.0000 | ORAL_TABLET | Freq: Every day | ORAL | Status: DC

## 2015-04-23 NOTE — Telephone Encounter (Signed)
This is pertaining to previous message: patient called for a refill on her Tri-Sprintec, she received a call back from the nurse stating that the generic brand has been called in, however she is wanting the brand name. I read her the message on what was said and asked her to call the pharmacy to make sure the brand name was there and she requested that we on our end make sure that it was also called in correctly. Stated that this refill is urgent.

## 2015-04-23 NOTE — Telephone Encounter (Signed)
Patient was informed via voice mail.

## 2015-04-23 NOTE — Telephone Encounter (Signed)
Called patient and informed her that the medication that she requested was submitted electronically to her preferred pharmacy and she said ok.

## 2015-04-23 NOTE — Telephone Encounter (Signed)
Let Aren know I re-sent the RX to [facility] specifically stating refill as Tri-Sprintec brand only.

## 2015-05-06 ENCOUNTER — Ambulatory Visit: Payer: BLUE CROSS/BLUE SHIELD | Admitting: Family Medicine

## 2015-05-09 ENCOUNTER — Ambulatory Visit: Payer: BLUE CROSS/BLUE SHIELD | Admitting: Family Medicine

## 2015-05-10 ENCOUNTER — Ambulatory Visit (INDEPENDENT_AMBULATORY_CARE_PROVIDER_SITE_OTHER): Payer: BLUE CROSS/BLUE SHIELD | Admitting: Family Medicine

## 2015-05-10 ENCOUNTER — Encounter: Payer: Self-pay | Admitting: Family Medicine

## 2015-05-10 VITALS — BP 112/70 | HR 87 | Temp 97.5°F | Resp 16 | Wt 139.5 lb

## 2015-05-10 DIAGNOSIS — L408 Other psoriasis: Secondary | ICD-10-CM

## 2015-05-10 DIAGNOSIS — R4586 Emotional lability: Secondary | ICD-10-CM

## 2015-05-10 DIAGNOSIS — F39 Unspecified mood [affective] disorder: Secondary | ICD-10-CM | POA: Diagnosis not present

## 2015-05-10 DIAGNOSIS — Z2821 Immunization not carried out because of patient refusal: Secondary | ICD-10-CM | POA: Diagnosis not present

## 2015-05-10 DIAGNOSIS — L308 Other specified dermatitis: Secondary | ICD-10-CM

## 2015-05-10 NOTE — Patient Instructions (Signed)

## 2015-05-10 NOTE — Progress Notes (Signed)
Name: Emma Edwards   MRN: [id number]    DOB: 1996/03/29   Date:05/10/2015       Progress Note  Subjective  Chief Complaint  Chief Complaint  Patient presents with  . Mental Health Problem    screening for bipolar; patient states her sx are irritability, anger and very aggressive to the point she wants to here things. patient also reports of high spurts of energy then the feeling of being drained.     HPI  Emma Edwards is a 19 year old female with know history of mood disorder, official diagnosis unclear and psoriasis who is here today to discuss referral to psychiatrist for official diagnosis of what she feels may be Bipolar disorder. Patient complains of bipolar disorder and anger bursts without being provoked.  She has the following symptoms: difficulty concentrating, feelings of losing control, irritable, psychomotor agitation, racing thoughts. Onset of symptoms was approximately several years ago but gradually worsening starting several months ago. She denies current suicidal and homicidal ideation. Family history significant for bipolar disorder in her Mother. Possible organic causes contributing are: none. Risk factors: positive family history in  mother. Previous treatment includes none, she is not interested in medication therapy and would prefer behavioral therapy first.  Her psoriasis lesions on her elbows knees are spreading she feels may be due to stress and mood changes. No negative events in life. She denies periods of excessive energy where she engages in risky activity (such as gambling, alcoholism, spending large amounts of money) or sleepless days. Her concern is her anger which she wants to learn to control.   Active Ambulatory Problems    Diagnosis Date Noted  . 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 Surveillance for birth control, oral contraceptives 02/07/2015  . History of kidney stones 02/07/2015  . History of migraine headaches 02/07/2015  . Menorrhagia with  regular cycle 02/07/2015  . Mood changes 02/07/2015  . Threatened abortion in first trimester 02/13/2015  . Influenza vaccination declined by patient 05/10/2015   Resolved Ambulatory Problems    Diagnosis Date Noted  . Pilonidal cyst 11/18/2012   Past Medical History  Diagnosis Date  . Headache(784.0) 2013  . Allergy   . Anxiety     Social History  Substance Use Topics  . Smoking status: Former Smoker -- 1.00 packs/day for 2 years  . Smokeless tobacco: Never Used  . Alcohol Use: No     Current outpatient prescriptions:  .  propranolol (INDERAL) 10 MG tablet, Take 10 mg by mouth 3 (three) times daily., Disp: , Rfl:  .  SUMAtriptan (IMITREX) 25 MG tablet, Take 25 mg by mouth as needed for migraine., Disp: , Rfl:  .  TRI-SPRINTEC 0.18/0.215/0.25 MG-35 MCG tablet, Take 1 tablet by mouth daily., Disp: 1 Package, Rfl: 11  History reviewed. No pertinent past surgical history.  Family History  Problem Relation Age of Onset  . Alcohol abuse Mother   . Depression Mother   . Drug abuse Mother   . Mental illness Mother   . Alcohol abuse Father   . Arthritis Father   . Depression Father   . Drug abuse Father   . Hypertension Father   . Hypertension Paternal Uncle   . Alcohol abuse Maternal Grandmother   . Depression Maternal Grandmother   . Drug abuse Maternal Grandmother   . Mental illness Maternal Grandmother   . Alcohol abuse Maternal Grandfather   . Drug abuse Maternal Grandfather   . Arthritis Paternal Grandmother   . Asthma  Paternal Grandmother   . Cancer Paternal Grandmother   . COPD Paternal Grandmother   . Hyperlipidemia Paternal Grandmother     Allergies  Allergen Reactions  . Penicillins Hives     Review of Systems  CONSTITUTIONAL: No significant weight changes, fever, chills, weakness or fatigue.  HEENT:  - Eyes: No visual changes.  - Ears: No auditory changes. No pain.  - Nose: No sneezing, congestion, runny nose. - Throat: No sore throat. No  changes in swallowing. SKIN: Psoriasis lesions spreading. CARDIOVASCULAR: No chest pain, chest pressure or chest discomfort. No palpitations or edema.  RESPIRATORY: No shortness of breath, cough or sputum.  NEUROLOGICAL: No headache, dizziness, syncope, paralysis, ataxia, numbness or tingling in the extremities. No memory changes.  PSYCHIATRIC: Yes change in mood. No change in sleep pattern.  ENDOCRINOLOGIC: No reports of sweating, cold or heat intolerance. No polyuria or polydipsia.   Depression screen [facility] 2/9 05/10/2015 02/07/2015  Decreased Interest 1 0  Down, Depressed, Hopeless 2 0  PHQ - 2 Score 3 0  Altered sleeping 1 -  Tired, decreased energy 3 -  Change in appetite 0 -  Feeling bad or failure about yourself  0 -  Trouble concentrating 2 -  Moving slowly or fidgety/restless 0 -  Suicidal thoughts 0 -  PHQ-9 Score 9 -      Objective  BP 112/70 mmHg  Pulse 87  Temp(Src) 97.5 F (36.4 C) (Oral)  Resp 16  Wt 139 lb 8 oz (63.277 kg)  SpO2 98% Body mass index is 23.93 kg/(m^2).  Physical Exam  Constitutional: Patient appears well-developed and well-nourished. In no distress.  HEENT:  - Head: Normocephalic and atraumatic.  - Ears: Bilateral TMs gray, no erythema or effusion - Nose: Nasal mucosa moist - Mouth/Throat: Oropharynx is clear and moist. No tonsillar hypertrophy or erythema. No post nasal drainage.  - Eyes: Conjunctivae clear, EOM movements normal. PERRLA. No scleral icterus.  Neck: Normal range of motion. Neck supple. No JVD present. No thyromegaly present.  Cardiovascular: Normal rate, regular rhythm and normal heart sounds.  No murmur heard.  Pulmonary/Chest: Effort normal and breath sounds normal. No respiratory distress. Skin: Skin is warm and dry. Cystic acne concentrated on face with scattered lesions on back and chest wall. Raised red scaly patch with well demarcated border on dorsal elbows and new patches on ankles.  Psychiatric: Patient has an  agitated mood and affect. Behavior is normal in office today. Judgment and thought content normal in office today.   Assessment & Plan  1. Mood changes Phq9 score 9 with no suicidal or homicidal thoughts expressed today. She is appropriately concerned about her mood fluctuations. We discussed Bipolar disorder in detail and strong family history. She is not interested in medication therapy at this time and is willing to consult with Psychiatrist for official testing and diagnosis and documentation of her mood disorder.  She has seen a therapist in the past but not recently.  - Ambulatory referral to Psyciatry  2. Psoriasiform dermatitis Gradually worsening and she will touch base with her Dermatologist.  3. Influenza vaccination declined by patient Understands risk of not being immunized.

## 2015-05-23 ENCOUNTER — Ambulatory Visit: Payer: BLUE CROSS/BLUE SHIELD | Admitting: Licensed Clinical Social Worker

## 2015-05-30 ENCOUNTER — Ambulatory Visit (INDEPENDENT_AMBULATORY_CARE_PROVIDER_SITE_OTHER): Payer: BLUE CROSS/BLUE SHIELD | Admitting: Licensed Clinical Social Worker

## 2015-05-30 DIAGNOSIS — F39 Unspecified mood [affective] disorder: Secondary | ICD-10-CM | POA: Diagnosis not present

## 2015-05-30 NOTE — Progress Notes (Signed)
Patient:   Emma Edwards   DOB:   06/30/1996  MR Number:  [id number]  Location:  [facility] [address] [place] [address] Dept: [phone]           Date of Service:   05/30/2015  Start Time:   3p End Time:   4p  Provider/Observer:  Marinda Elk Counselor       Billing Code/Service: [phone]  Behavioral Observation: Emma Edwards  presents as a 19 y.o.-year-old Caucasian Female who appeared her stated age. her dress was Appropriate and she was Casual and her manners were Appropriate to the situation.  There were not any physical disabilities noted.  she displayed an appropriate level of cooperation and motivation.    Interactions:    Active   Attention:   within normal limits  Memory:   within normal limits  Speech (Volume):  normal  Speech:   normal volume  Thought Process:  Coherent  Though Content:  WNL  Orientation:   person, place, time/date and situation  Judgment:   Good  Planning:   Good  Affect:    Anxious  Mood:    Anxious  Insight:   Good  Intelligence:   normal  Chief Complaint:     Chief Complaint  Patient presents with  . Agitation  . Establish Care    Reason for Service:  "I want to feel more peaceful with myself"  Current Symptoms:  Irritable, angry, hits wall, frustrated, verbally aggressive, on edge, feels sick on stomach, smokes often to help calm her down, racing thoughts about anything, obsessive thoughts,  Burst of energy, unable to concentrate  May last 3-4 days Significantly worse in the last 2 years but has felt this way since teenage years  Source of Distress:              unknown  Marital Status/Living: Single/lives with boyfriend, best friend and best friend boyfriend  Employment History: Works part time at the [employer]; mood effects work due to mood and will call in when  angry  Education:   Automotive engineer; 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  Mother was on drugs at age 5 or 69; bounced around from age [age]   Children/Grand-children:    none  Natural/Informal Support:                          Boyfriend and best friend   Substance Use:  There is a documented history of marijuana and tobacco abuse confirmed by the patient.  Cigarettes since age 19; about 1/2 pack - 1 pack; Marlboro Menthol Marijuana since age 19; 2 bowls daily at night helps her relax   Medical History:   Past Medical History  Diagnosis Date  . Pilonidal cyst 2014  . Headache(784.0) 2013  . Allergy   . Anxiety  Medication List       This list is accurate as of: 05/30/15  3:10 PM.  Always use your most recent med list.               propranolol 10 MG tablet  Commonly known as:  INDERAL  Take 10 mg by mouth 3 (three) times daily.     SUMAtriptan 25 MG tablet  Commonly known as:  IMITREX  Take 25 mg by mouth as needed for migraine.     TRI-SPRINTEC 0.18/0.215/0.25 MG-35 MCG tablet  Generic drug:  Norgestimate-Ethinyl Estradiol Triphasic  Take 1 tablet by mouth daily.              Sexual History:   History  Sexual Activity  . Sexual Activity: Yes     Abuse/Trauma History: Denies    Psychiatric History:  While in 7th grade and January 2016   Strengths:   Writing, drawing   Recovery Goals:  "I want to feel more peaceful with myself"  Hobbies/Interests:               Read, draw, writing, sit outside and reading, hiking   Challenges/Barriers: Anger, mouth, "I can be a very mean  person"    Family Med/Psych History:  Family History  Problem Relation Age of Onset  . Alcohol abuse Mother   . Depression Mother   . Drug abuse Mother   . Mental illness Mother   . Alcohol abuse Father   . Arthritis Father   . Depression Father   . Drug abuse Father   . Hypertension Father   . Hypertension Paternal Uncle   . Alcohol abuse Maternal Grandmother   . Depression Maternal Grandmother   . Drug abuse Maternal Grandmother   . Mental illness Maternal Grandmother   . Alcohol abuse Maternal Grandfather   . Drug abuse Maternal Grandfather   . Arthritis Paternal Grandmother   . Asthma Paternal Grandmother   . Cancer Paternal Grandmother   . COPD Paternal Grandmother   . Hyperlipidemia Paternal Grandmother     Risk of Suicide/Violence: low   History of Suicide/Violence:  Denies   Psychosis:   Denies   Diagnosis:    Mood Disorder NOS  Impression/DX:  Emma Edwards is currently diagnosed with Mood Disorder Unspecified due to her mixed symptoms.  She has Irritable, angry, hits wall, frustrated, verbally aggressive, on edge, feels sick on stomach, smokes often to help calm her down, racing thoughts about anything, obsessive thoughts, Burst of energy, unable to concentrate, may last 3-4 days.  Significantly worse in the last 2 years but has felt this way since teenage years.  She is currently works part time, is in a long term relationship and has no children.  She and her boyfriend are currently having relationship difficulties.  Emma Edwards will be best supported by medication management and outpatient therapy to assist with coping skills and understanding her triggers.  Emma Edwards does have a history of SI attempts and denies current thoughts.  She has protective factors.  She has several positive relationships.  Recommendation/Plan: Writer recommends Outpatient Therapy at least twice monthly to include but not limited to individual, group and or family therapy.  Medication Management is  also recommended to assist with her mood.

## 2015-06-27 ENCOUNTER — Ambulatory Visit: Payer: BLUE CROSS/BLUE SHIELD | Admitting: Psychiatry

## 2015-07-10 ENCOUNTER — Telehealth: Payer: Self-pay | Admitting: Family Medicine

## 2015-07-10 NOTE — Telephone Encounter (Signed)
Take OTC home pregnancy test, if symptoms worsen despite taking antibiotics proceed to ER , even if she saw me in office it may be hard to get any ultrasound or imaging done next week due to holiday.

## 2015-07-10 NOTE — Telephone Encounter (Signed)
[facility] because we where completely booked. Her lower right side is still hurting please advise. They told her it was a UTI and was prescribed antibiotics.

## 2015-07-10 NOTE — Telephone Encounter (Signed)
Patient stated that she did not have the pain when she went to the doctor. They tested her urine and found that she had a UTI with some bacteria but when the cx came back they told her to make sure she takes the full dose of medicine to clear up all of the bacteria. She has already done a pregnancy and it was negative. She was given Septra and stated that she has about 3 days left.  The area of concern is the right lower quadrant and it is tender to the touch.  I told her that I will consult with Dr. Sherley BoundsSundaram and then I will give her a call back.

## 2015-07-11 ENCOUNTER — Ambulatory Visit: Payer: BLUE CROSS/BLUE SHIELD | Admitting: Licensed Clinical Social Worker

## 2015-07-11 NOTE — Telephone Encounter (Signed)
Could be appendicitis, ovarian cyst, pelvic inflammatory dz. Some of these are emergencies and needs imaging and blood work so if symptoms persist or worsen needs to go to ER.

## 2015-07-11 NOTE — Telephone Encounter (Signed)
Patient was informed of Dr. Debby FreibergSundaram's message and was encouraged to give us a call if she had any additional questions or concerns.

## 2015-08-01 ENCOUNTER — Ambulatory Visit: Payer: BLUE CROSS/BLUE SHIELD | Admitting: Psychiatry

## 2015-09-09 ENCOUNTER — Encounter: Payer: Self-pay | Admitting: Family Medicine

## 2015-09-09 ENCOUNTER — Ambulatory Visit (INDEPENDENT_AMBULATORY_CARE_PROVIDER_SITE_OTHER): Payer: BLUE CROSS/BLUE SHIELD | Admitting: Family Medicine

## 2015-09-09 VITALS — BP 122/70 | HR 106 | Temp 97.1°F | Resp 16 | Ht 64.0 in | Wt 141.1 lb

## 2015-09-09 DIAGNOSIS — L723 Sebaceous cyst: Secondary | ICD-10-CM | POA: Diagnosis not present

## 2015-09-09 NOTE — Progress Notes (Addendum)
Name: Emma Edwards   MRN: [id number]    DOB: May 10, 1996   Date:09/09/2015       Progress Note  Subjective  Chief Complaint  Chief Complaint  Patient presents with  . Cyst    knot on back of head, tender, hard and painful. Notice 1 month ago it went away then came back last week    HPI  Cyst  Patient presents with a hard tender nodule on the back of her head for the last 1 month. She fell one time as if it has some fluid beneath it. Last week was slightly tender. No drainage or bleeding from the lesion.  Past Medical History  Diagnosis Date  . Pilonidal cyst 2014  . Headache(784.0) 2013  . Allergy   . Anxiety     Social History  Substance Use Topics  . Smoking status: Former Smoker -- 1.00 packs/day for 2 years  . Smokeless tobacco: Never Used  . Alcohol Use: No     Current outpatient prescriptions:  .  propranolol (INDERAL) 10 MG tablet, Take 10 mg by mouth 3 (three) times daily. Reported on 09/09/2015, Disp: , Rfl:  .  SUMAtriptan (IMITREX) 25 MG tablet, Take 25 mg by mouth as needed for migraine. Reported on 09/09/2015, Disp: , Rfl:  .  TRI-SPRINTEC 0.18/0.215/0.25 MG-35 MCG tablet, Take 1 tablet by mouth daily. (Patient not taking: Reported on 09/09/2015), Disp: 1 Package, Rfl: 11  Allergies  Allergen Reactions  . Penicillins Hives    Review of Systems  Constitutional: Negative for fever, chills and weight loss.  Skin: Negative for itching.       Lesion on the back of head.  Endo/Heme/Allergies: Does not bruise/bleed easily.     Objective  Filed Vitals:   09/09/15 0826  BP: 122/70  Pulse: 106  Temp: 97.1 F (36.2 C)  TempSrc: Oral  Resp: 16  Height: 5\' 4"  (1.626 m)  Weight: 141 lb 1.6 oz (64.003 kg)  SpO2: 97%     Physical Exam  Constitutional: She is well-developed, well-nourished, and in no distress.  HENT:  Head: Normocephalic.  Skin:  1/2 cm sebaceous cyst palpable just medial to the left occipital protuberance. Extremities mobile and  nontender not erythematous      Assessment & Plan  1. Sebaceous cyst Educational handout on sebaceous cyst

## 2015-09-09 NOTE — Patient Instructions (Signed)
Epidermal Cyst An epidermal cyst is sometimes called a sebaceous cyst, epidermal inclusion cyst, or infundibular cyst. These cysts usually contain a substance that looks "pasty" or "cheesy" and may have a bad smell. This substance is a protein called keratin. Epidermal cysts are usually found on the face, neck, or trunk. They may also occur in the vaginal area or other parts of the genitalia of both men and women. Epidermal cysts are usually small, painless, slow-growing bumps or lumps that move freely under the skin. It is important not to try to pop them. This may cause an infection and lead to tenderness and swelling. CAUSES  Epidermal cysts may be caused by a deep penetrating injury to the skin or a plugged hair follicle, often associated with acne. SYMPTOMS  Epidermal cysts can become inflamed and cause:  Redness.  Tenderness.  Increased temperature of the skin over the bumps or lumps.  Grayish-white, bad smelling material that drains from the bump or lump. DIAGNOSIS  Epidermal cysts are easily diagnosed by your caregiver during an exam. Rarely, a tissue sample (biopsy) may be taken to rule out other conditions that may resemble epidermal cysts. TREATMENT   Epidermal cysts often get better and disappear on their own. They are rarely ever cancerous.  If a cyst becomes infected, it may become inflamed and tender. This may require opening and draining the cyst. Treatment with antibiotics may be necessary. When the infection is gone, the cyst may be removed with minor surgery.  Small, inflamed cysts can often be treated with antibiotics or by injecting steroid medicines.  Sometimes, epidermal cysts become large and bothersome. If this happens, surgical removal in your caregiver's office may be necessary. HOME CARE INSTRUCTIONS  Only take over-the-counter or prescription medicines as directed by your caregiver.  Take your antibiotics as directed. Finish them even if you start to feel  better. SEEK MEDICAL CARE IF:   Your cyst becomes tender, red, or swollen.  Your condition is not improving or is getting worse.  You have any other questions or concerns. MAKE SURE YOU:  Understand these instructions.  Will watch your condition.  Will get help right away if you are not doing well or get worse.   This information is not intended to replace advice given to you by your health care provider. Make sure you discuss any questions you have with your health care provider.   Document Released: 07/11/2004 Document Revised: 11/02/2011 Document Reviewed: 02/16/2011 Elsevier Interactive Patient Education 2016 Elsevier Inc.  

## 2015-11-27 ENCOUNTER — Telehealth: Payer: Self-pay

## 2015-11-27 NOTE — Telephone Encounter (Signed)
Done and placed up front

## 2015-11-27 NOTE — Telephone Encounter (Signed)
Okay for note; if persistent, then come in for visit

## 2015-11-27 NOTE — Telephone Encounter (Signed)
Patient called has a diagnosis of migraines and states she has had one since 3am.  Wants to see if she can geta doctors note for just today, she does not have the money to come in and pay copay?

## 2016-03-27 ENCOUNTER — Ambulatory Visit: Payer: Self-pay | Admitting: Family Medicine

## 2016-04-23 ENCOUNTER — Ambulatory Visit: Payer: Self-pay | Admitting: Family Medicine

## 2016-06-24 ENCOUNTER — Encounter: Payer: Self-pay | Admitting: Family Medicine

## 2016-06-24 ENCOUNTER — Ambulatory Visit (INDEPENDENT_AMBULATORY_CARE_PROVIDER_SITE_OTHER): Payer: 59 | Admitting: Family Medicine

## 2016-06-24 DIAGNOSIS — N9089 Other specified noninflammatory disorders of vulva and perineum: Secondary | ICD-10-CM | POA: Diagnosis not present

## 2016-06-24 DIAGNOSIS — Z113 Encounter for screening for infections with a predominantly sexual mode of transmission: Secondary | ICD-10-CM | POA: Insufficient documentation

## 2016-06-24 DIAGNOSIS — N898 Other specified noninflammatory disorders of vagina: Secondary | ICD-10-CM | POA: Insufficient documentation

## 2016-06-24 NOTE — Patient Instructions (Addendum)
We'll have you see the gynecologist about the changes you found on your labia We'll contact you about the test results If you have not heard anything from my staff in a week about any orders/referrals/studies from today, please contact us here to follow-up [phone]  Safe Sex Safe sex is about reducing the risk of giving or getting a sexually transmitted disease (STD). STDs are spread through sexual contact involving the genitals, mouth, or rectum. Some STDs can be cured and others cannot. Safe sex can also prevent unintended pregnancies.  WHAT ARE SOME SAFE SEX PRACTICES?  Limit your sexual activity to only one partner who is having sex with only you.  Talk to your partner about his or her past partners, past STDs, and drug use.  Use a condom every time you have sexual intercourse. This includes vaginal, oral, and anal sexual activity. Both females and males should wear condoms during oral sex. Only use latex or polyurethane condoms and water-based lubricants. Using petroleum-based lubricants or oils to lubricate a condom will weaken the condom and increase the chance that it will break. The condom should be in place from the beginning to the end of sexual activity. Wearing a condom reduces, but does not completely eliminate, your risk of getting or giving an STD. STDs can be spread by contact with infected body fluids and skin.  Get vaccinated for hepatitis B and HPV.  Avoid alcohol and recreational drugs, which can affect your judgment. You may forget to use a condom or participate in high-risk sex.  For females, avoid douching after sexual intercourse. Douching can spread an infection farther into the reproductive tract.  Check your body for signs of sores, blisters, rashes, or unusual discharge. See your health care provider if you notice any of these signs.  Avoid sexual contact if you have symptoms of an infection or are being treated for an STD. If you or your partner has herpes,  avoid sexual contact when blisters are present. Use condoms at all other times.  If you are at risk of being infected with HIV, it is recommended that you take a prescription medicine daily to prevent HIV infection. This is called pre-exposure prophylaxis (PrEP). You are considered at risk if:  You are a man who has sex with other men (MSM).  You are a heterosexual man or woman who is sexually active with more than one partner.  You take drugs by injection.  You are sexually active with a partner who has HIV.  Talk with your health care provider about whether you are at high risk of being infected with HIV. If you choose to begin PrEP, you should first be tested for HIV. You should then be tested every 3 months for as long as you are taking PrEP.  See your health care provider for regular screenings, exams, and tests for other STDs. Before having sex with a new partner, each of you should be screened for STDs and should talk about the results with each other. WHAT ARE THE BENEFITS OF SAFE SEX?   There is less chance of getting or giving an STD.  You can prevent unwanted or unintended pregnancies.  By discussing safe sex concerns with your partner, you may increase feelings of intimacy, comfort, trust, and honesty between the two of you.   This information is not intended to replace advice given to you by your health care provider. Make sure you discuss any questions you have with your health care provider.  Document Released: 09/17/2004 Document Revised: 08/31/2014 Document Reviewed: 02/01/2012 Elsevier Interactive Patient Education [employer].

## 2016-06-24 NOTE — Assessment & Plan Note (Addendum)
Refer to GYN; almost has sebaceous gland type appearance, but cannot rule out flat warts, especially given the kissing lesion symmetry; will have her see specialist

## 2016-06-24 NOTE — Progress Notes (Signed)
BP 120/70 (BP Location: Left Arm, Patient Position: Sitting, Cuff Size: Normal)   Pulse 92   Temp 98.3 F (36.8 C) (Oral)   Resp 14   Ht 5\' 4"  (1.626 m)   Wt 150 lb 4 oz (68.2 kg)   LMP 06/01/2016   SpO2 99%   BMI 25.79 kg/m    Subjective:    Patient ID: Emma Edwards, female    DOB: [birth year], 20 y.o.   MRN: [id number]  HPI: Emma Edwards is a 20 y.o. female  Chief Complaint  Patient presents with  . Vaginitis    2 weeks ago; treated with OTC Medications   . Exposure to STD    STD Testing ; white bumps around lips of vagina    Vaginitis x 2 weeks; did 7 day monistat; did help some but not completely No itching White clusters on both sides, very symmetrical; not painful; not symptomatic at all No shaving Monogamous, no change in products Tracks fertility, natural family planning, using app call Eve No pelvic pain No fevers She has already had the Gardisil series years ago when she was at the pediatrician's office She received her tetanus booster at the same time  [facility] 06/24/2016 09/09/2015 05/10/2015 02/07/2015  Decreased Interest 0 0 1 0  Down, Depressed, Hopeless 0 0 2 0  PHQ - 2 Score 0 0 3 0  Altered sleeping - - 1 -  Tired, decreased energy - - 3 -  Change in appetite - - 0 -  Feeling bad or failure about yourself  - - 0 -  Trouble concentrating - - 2 -  Moving slowly or fidgety/restless - - 0 -  Suicidal thoughts - - 0 -  PHQ-9 Score - - 9 -   Relevant past medical, surgical, family and social history reviewed Past Medical History:  Diagnosis Date  . Allergy   . Anxiety   . Headache(784.0) 2013  . Pilonidal cyst 2014   History reviewed. No pertinent surgical history.   Social History  Substance Use Topics  . Smoking status: Former Smoker    Packs/day: 1.00    Years: 2.00  . Smokeless tobacco: Never Used  . Alcohol use No    Interim medical history since last visit reviewed. Allergies and medications  reviewed  Review of Systems Per HPI unless specifically indicated above     Objective:    BP 120/70 (BP Location: Left Arm, Patient Position: Sitting, Cuff Size: Normal)   Pulse 92   Temp 98.3 F (36.8 C) (Oral)   Resp 14   Ht 5\' 4"  (1.626 m)   Wt 150 lb 4 oz (68.2 kg)   LMP 06/01/2016   SpO2 99%   BMI 25.79 kg/m   Wt Readings from Last 3 Encounters:  06/24/16 150 lb 4 oz (68.2 kg)  09/09/15 141 lb 1.6 oz (64 kg)  05/10/15 139 lb 8 oz (63.3 kg)    Physical Exam  Constitutional: She appears well-developed and well-nourished. No distress.  Eyes: EOM are normal.  Cardiovascular: Normal rate.   Pulmonary/Chest: Effort normal.  Abdominal: Soft. Bowel sounds are normal. She exhibits no distension and no mass. There is no tenderness. There is no guarding.  Genitourinary:    There is lesion on the right labia. There is no rash or tenderness on the right labia. There is lesion on the left labia. There is no rash or tenderness on the left labia. Uterus is not enlarged and not  tender. Cervix exhibits no motion tenderness, no discharge and no friability. Vaginal discharge (whitish, no fishy odor) found.  Genitourinary Comments: Nearly flat whitish lesions about 2 x 3 mm on left and right labia, "kissing" lesions; appear to be made up of numerous tiny papules, almost sebaceous gland in appearance; does not appear consistent with herpetic lesions, no vesicles; no erythema  Skin: No pallor.  Psychiatric: She has a normal mood and affect.    Results for orders placed or performed in visit on 02/22/15  Beta HCG, Quant (tumor marker)  Result Value Ref Range   hCG Quant <1 mIU/mL      Assessment & Plan:   Problem List Items Addressed This Visit      Other   Vaginal discharge    Wet mount collected; ddx includes yeast infection, BV; will contact her when results are back      Relevant Orders   WET PREP BY MOLECULAR PROBE   Screen for STD (sexually transmitted disease)    Ordering  labs      Relevant Orders   RPR   GC/Chlamydia Probe Amp   HIV antibody   Urinalysis w microscopic + reflex cultur   Hepatitis panel, acute   Labial lesion    Refer to GYN; almost has sebaceous gland type appearance, but cannot rule out flat warts, especially given the kissing lesion symmetry; will have her see specialist       Other Visit Diagnoses   None.      Follow up plan: Return if symptoms worsen or fail to improve.  An after-visit summary was printed and given to the patient at check-out.  Please see the patient instructions which may contain other information and recommendations beyond what is mentioned above in the assessment and plan.  No orders of the defined types were placed in this encounter.   Orders Placed This Encounter  Procedures  . GC/Chlamydia Probe Amp  . WET PREP BY MOLECULAR PROBE  . RPR  . HIV antibody  . Urinalysis w microscopic + reflex cultur  . Hepatitis panel, acute

## 2016-06-24 NOTE — Assessment & Plan Note (Signed)
Wet mount collected; ddx includes yeast infection, BV; will contact her when results are back

## 2016-06-24 NOTE — Assessment & Plan Note (Signed)
Ordering labs

## 2016-06-25 LAB — URINALYSIS W MICROSCOPIC + REFLEX CULTURE
Bacteria, UA: NONE SEEN [HPF]
Bilirubin Urine: NEGATIVE
CASTS: NONE SEEN [LPF]
CRYSTALS: NONE SEEN [HPF]
Glucose, UA: NEGATIVE
Hgb urine dipstick: NEGATIVE
KETONES UR: NEGATIVE
Leukocytes, UA: NEGATIVE
Nitrite: NEGATIVE
Protein, ur: NEGATIVE
RBC / HPF: NONE SEEN RBC/HPF (ref <=2)
SPECIFIC GRAVITY, URINE: 1.025 (ref 1.001–1.035)
Squamous Epithelial / LPF: NONE SEEN [HPF] (ref <=5)
WBC, UA: NONE SEEN WBC/HPF (ref <=5)
YEAST: NONE SEEN [HPF]
pH: 6.5 (ref 5.0–8.0)

## 2016-06-25 LAB — WET PREP BY MOLECULAR PROBE
CANDIDA SPECIES: NEGATIVE
Gardnerella vaginalis: NEGATIVE
Trichomonas vaginosis: NEGATIVE

## 2016-06-25 LAB — HEPATITIS PANEL, ACUTE
HCV Ab: NEGATIVE
HEP B C IGM: NONREACTIVE
HEP B S AG: NEGATIVE
Hep A IgM: NONREACTIVE

## 2016-06-25 LAB — RPR

## 2016-06-25 LAB — GC/CHLAMYDIA PROBE AMP
CT PROBE, AMP APTIMA: NOT DETECTED
GC Probe RNA: NOT DETECTED

## 2016-06-25 LAB — HIV ANTIBODY (ROUTINE TESTING W REFLEX): HIV: NONREACTIVE

## 2016-06-26 ENCOUNTER — Telehealth: Payer: Self-pay

## 2016-06-26 NOTE — Telephone Encounter (Signed)
-----   Message from Kerman PasseyMelinda P Lada, MD sent at 06/25/2016  2:33 PM EDT ----- Please let pt know that her labs are all norma; good news; thank you

## 2016-06-26 NOTE — Telephone Encounter (Signed)
Called pt regarding lab results. Pt also concern about her referral to GYN. Suggest to pt if she does not hear anything back within a week form her visit with us to give us a call back.

## 2016-07-06 ENCOUNTER — Other Ambulatory Visit: Payer: Self-pay

## 2016-07-06 DIAGNOSIS — N9089 Other specified noninflammatory disorders of vulva and perineum: Secondary | ICD-10-CM

## 2016-08-26 IMAGING — US US OB COMP LESS 14 WK
1 series · 14 of 28 positions shown · non-contrast
Comparison: None.

CLINICAL DATA: Patient with bright red vaginal bleeding.

EXAM:
OBSTETRIC <14 WK US AND TRANSVAGINAL OB US
TECHNIQUE: Both transabdominal and transvaginal ultrasound examinations were
performed for complete evaluation of the gestation as well as the
maternal uterus, adnexal regions, and pelvic cul-de-sac.
Transvaginal technique was performed to assess early pregnancy.

[Series 1: us ob comp less 14 wk · 0.20mm/px · 14 of 49 slices shown]
[im 2/49]
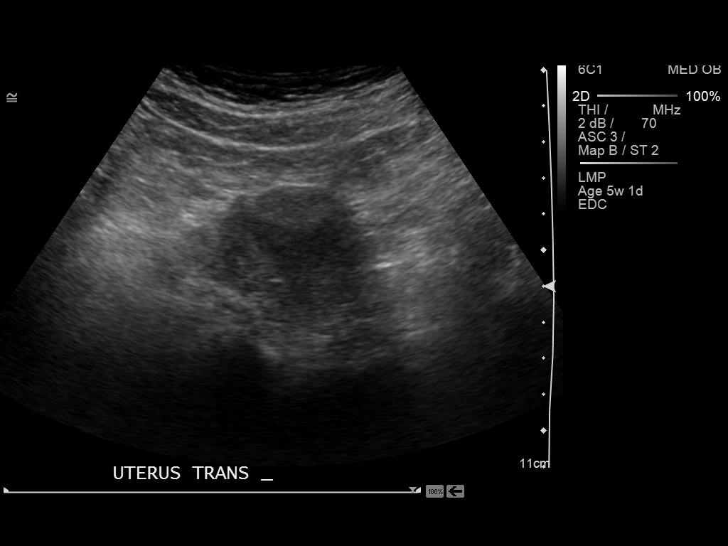
[im 6/49]
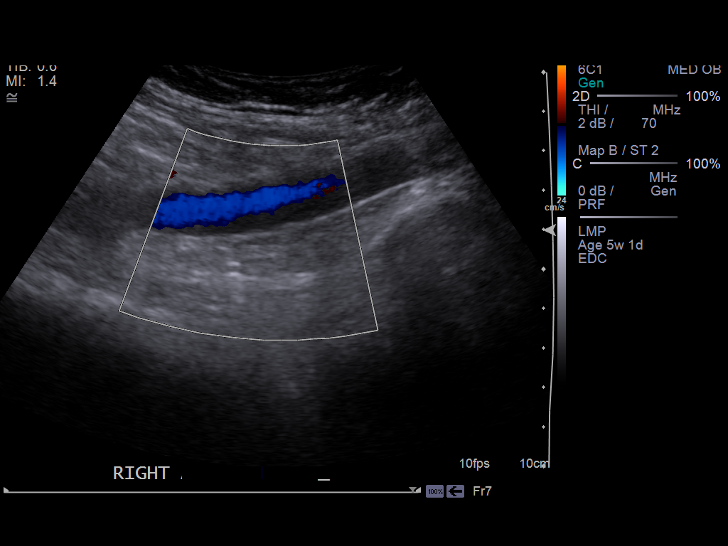
[im 9/49]
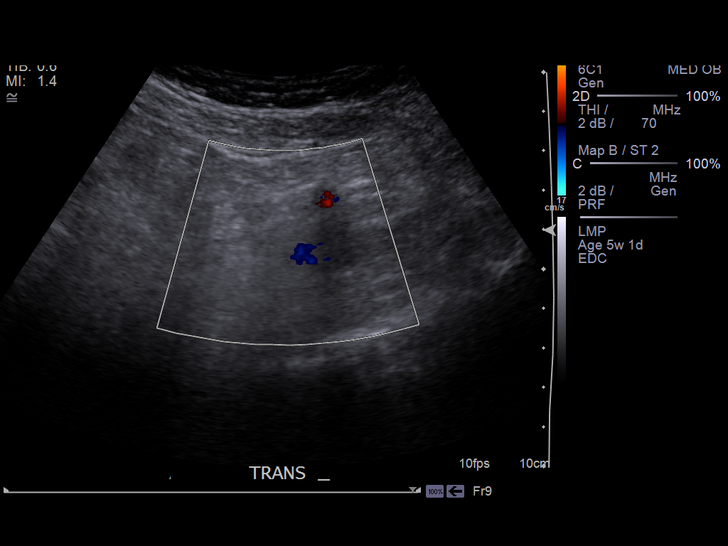
[im 13/49]
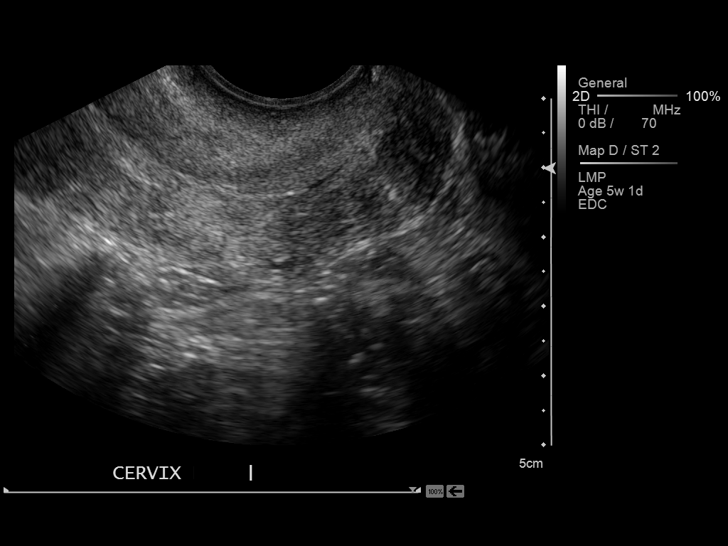
[im 17/49]
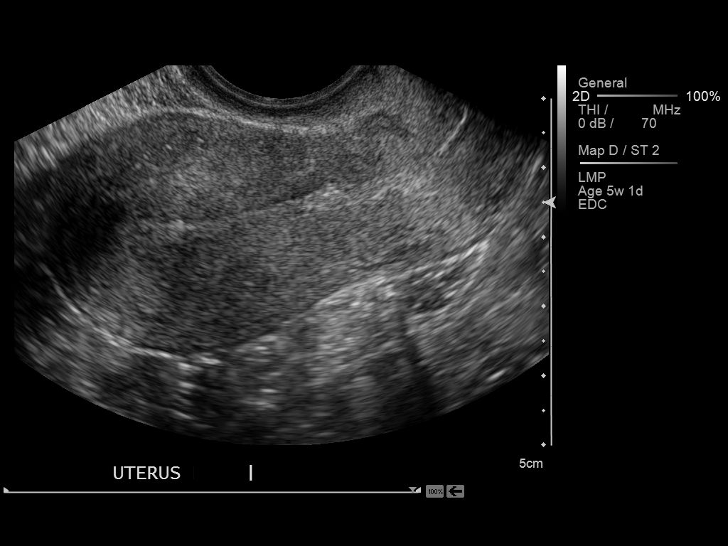
[im 20/49]
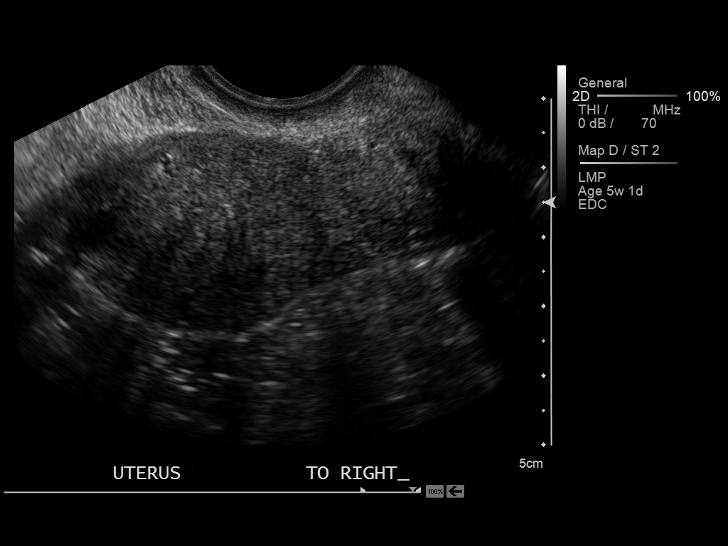
[im 24/49]
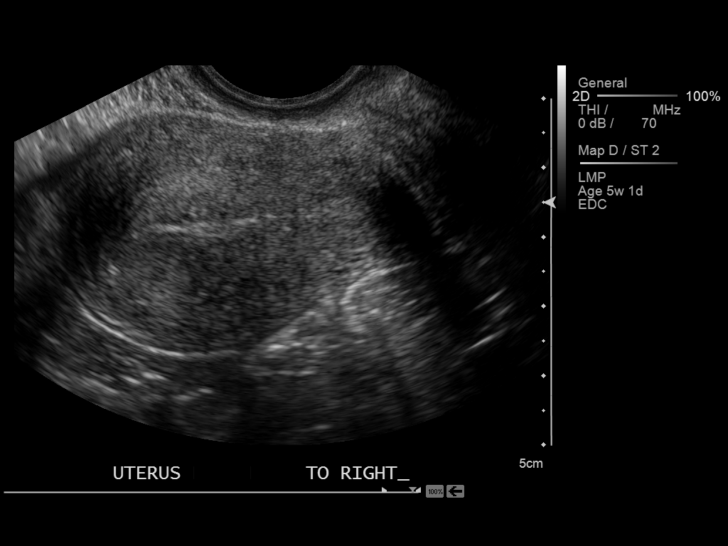
[im 27/49]
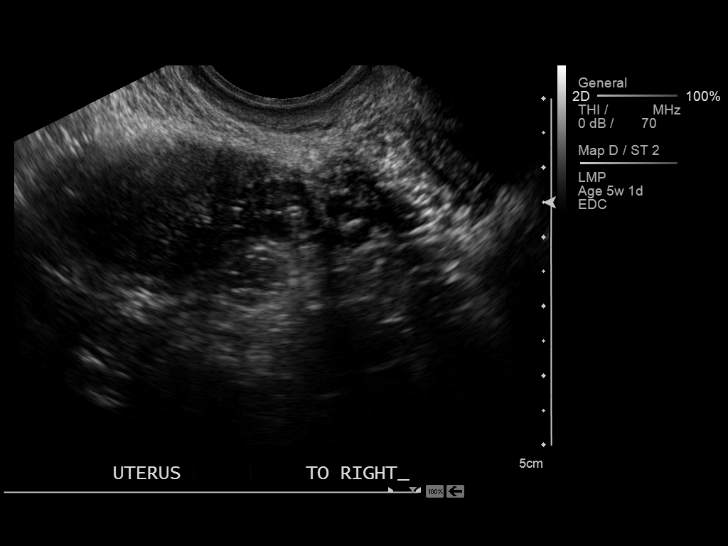
[im 31/49]
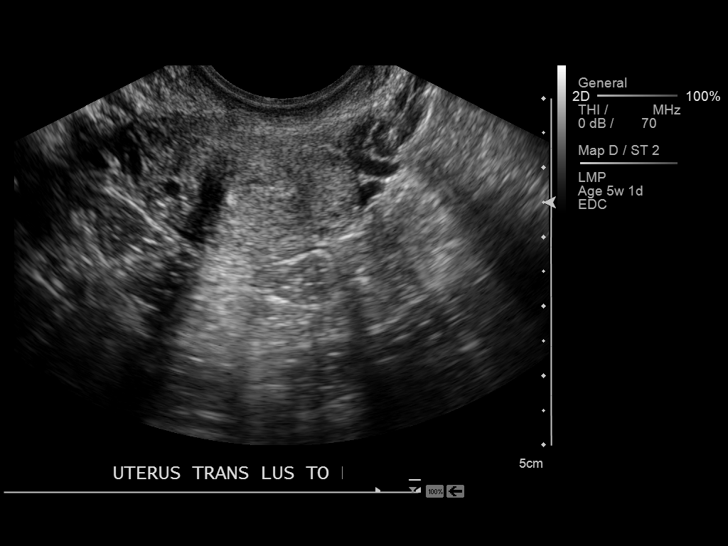
[im 34/49]
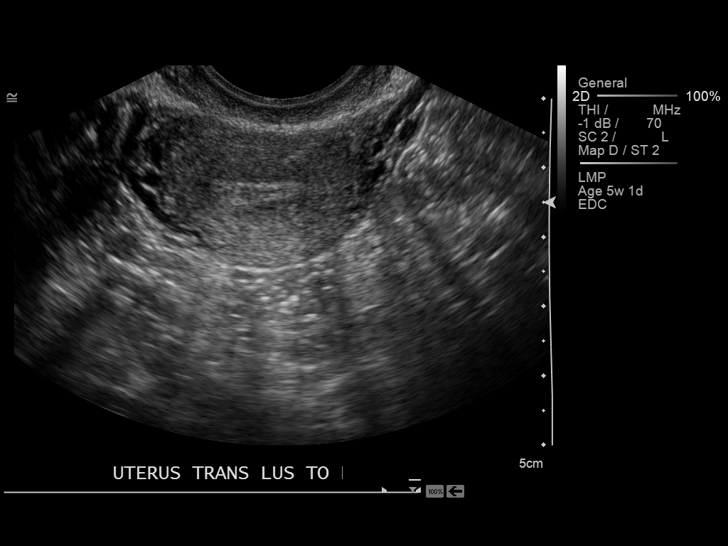
[im 38/49]
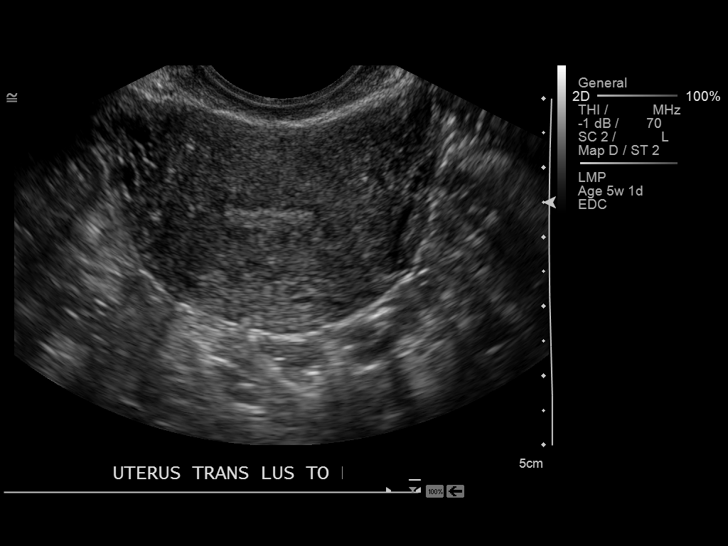
[im 41/49]
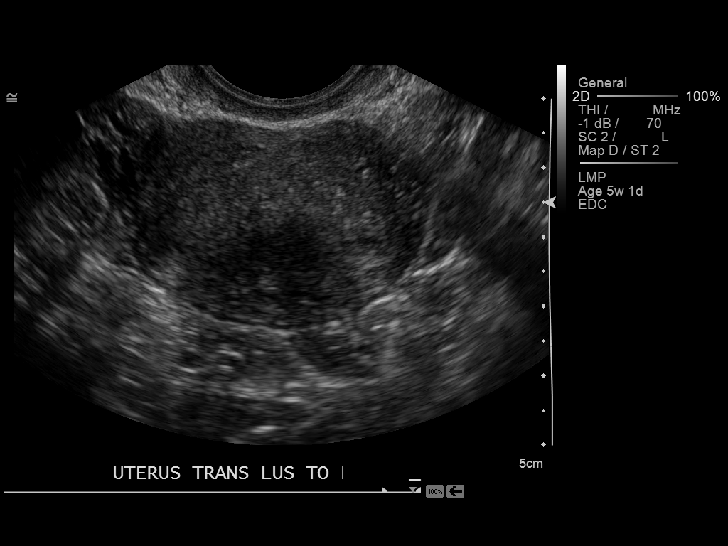
[im 45/49]
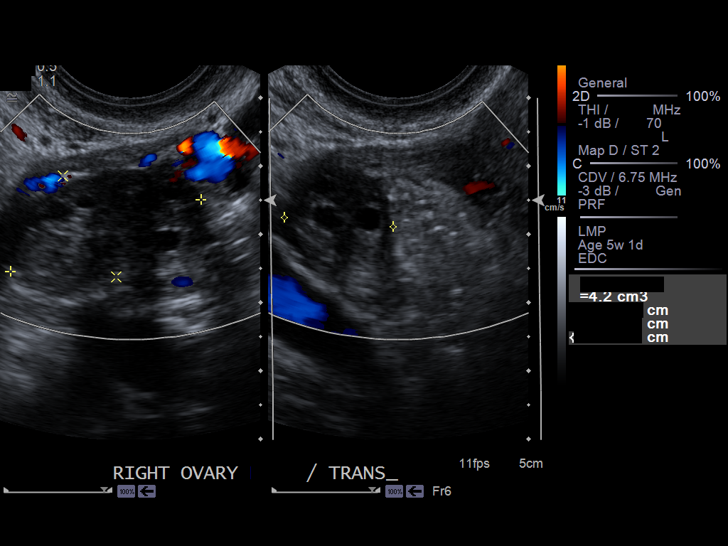
[im 49/49]
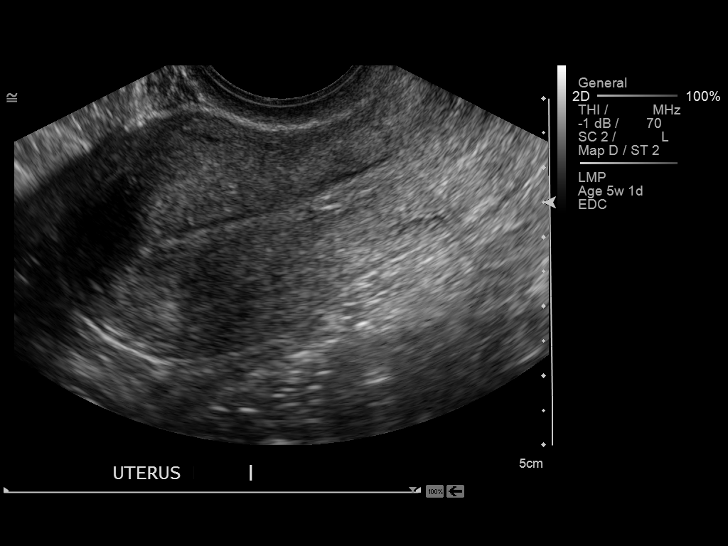

[14 of 28 positions shown; findings below may reference images not displayed]

FINDINGS: Intrauterine gestational sac: Not present

Yolk sac:  Not present

Embryo:  Not present

Maternal uterus/adnexae: Normal right and left ovaries. No
significant free fluid in the pelvis.
IMPRESSION: No intrauterine gestational sac identified. In the setting of
positive pregnancy test and no definite intrauterine pregnancy, this
reflects a pregnancy of unknown location. Differential
considerations include early normal IUP, abnormal IUP, or
nonvisualized ectopic pregnancy. Differentiation is achieved with
serial beta HCG supplemented by repeat sonography as clinically
warranted.

Findings discussed with Suchi Fikes, RN at [DATE] a.m. on
02/13/2015.

## 2016-09-01 ENCOUNTER — Encounter: Payer: Self-pay | Admitting: Obstetrics and Gynecology

## 2016-09-02 ENCOUNTER — Ambulatory Visit (INDEPENDENT_AMBULATORY_CARE_PROVIDER_SITE_OTHER): Payer: 59 | Admitting: Obstetrics and Gynecology

## 2016-09-02 ENCOUNTER — Encounter: Payer: Self-pay | Admitting: Obstetrics and Gynecology

## 2016-09-02 VITALS — BP 122/75 | HR 105 | Ht 64.0 in | Wt 148.7 lb

## 2016-09-02 DIAGNOSIS — N9089 Other specified noninflammatory disorders of vulva and perineum: Secondary | ICD-10-CM

## 2016-09-02 NOTE — Patient Instructions (Signed)
1. Vulvar biopsy is performed today 2. Return in 1 week for follow-up on biopsy and further management planning  VULVAR BIOPSY POST-PROCEDURE INSTRUCTIONS  1. You may take Ibuprofen, Aleve or Tylenol for pain if needed.    2. You may have a small amount of spotting.  You should wear a mini pad for the next few days.  3. You may use some topical Neosporin ointment if you would like (over the counter is fine).  4. You need to call if you have redness around the biopsy site, if there is any unusual draining, if the bleeding is heavy, or if you are concerned.  5. Shower or bathe as normal  6. We will call you within one week with results or we will discuss the results at your follow-up appointment if needed.

## 2016-09-02 NOTE — Progress Notes (Signed)
GYN ENCOUNTER NOTE  Subjective:  New patient evaluation     Referring physician: Dr. Micah Noel Emma Edwards is a 21 y.o. G1P0010 female is here for gynecologic evaluation of the following issues:  1. Asymptomatic vulvar lesion  Patient noted bilateral labia minora white lesions in the "kissing pattern". No vaginal discharge, vaginal burning, vaginal pain, new partner, new soaps, new clothing. Patient is status post RSO vaccine series. She is monogamous.  Review of history and testing is notable for a negative STD screen.   Gynecologic History Patient's last menstrual period was 08/22/2016 (exact date). Contraception: rhythm method  Obstetric History OB History  Gravida Para Term Preterm AB Living  1       1    SAB TAB Ectopic Multiple Live Births  1            # Outcome Date GA Lbr Len/2nd Weight Sex Delivery Anes PTL Lv  1 SAB               Past Medical History:  Diagnosis Date  . Allergy   . Anxiety   . Headache(784.0) 2013  . Pilonidal cyst 2014    No past surgical history on file.  No current outpatient prescriptions on file prior to visit.   No current facility-administered medications on file prior to visit.     Allergies  Allergen Reactions  . Penicillins Hives    Social History   Social History  . Marital status: Single    Spouse name: N/A  . Number of children: N/A  . Years of education: N/A   Occupational History  . Not on file.   Social History Main Topics  . Smoking status: Former Smoker    Packs/day: 1.00    Years: 2.00    Quit date: 02/22/2016  . Smokeless tobacco: Never Used  . Alcohol use No  . Drug use: No  . Sexual activity: Yes    Birth control/ protection: Coitus interruptus   Other Topics Concern  . Not on file   Social History Narrative  . No narrative on file    Family History  Problem Relation Age of Onset  . Alcohol abuse Mother   . Depression Mother   . Drug abuse Mother   . Mental illness Mother   .  Alcohol abuse Father   . Arthritis Father   . Depression Father   . Drug abuse Father   . Hypertension Father   . Hypertension Paternal Uncle   . Alcohol abuse Maternal Grandmother   . Depression Maternal Grandmother   . Drug abuse Maternal Grandmother   . Mental illness Maternal Grandmother   . Alcohol abuse Maternal Grandfather   . Drug abuse Maternal Grandfather   . Arthritis Paternal Grandmother   . Asthma Paternal Grandmother   . Cancer Paternal Grandmother   . COPD Paternal Grandmother   . Hyperlipidemia Paternal Grandmother     The following portions of the patient's history were reviewed and updated as appropriate: allergies, current medications, past family history, past medical history, past social history, past surgical history and problem list.  Review of Systems Review of Systems - Negative comprehensive review of systems   Objective:   BP 122/75   Pulse (!) 105   Ht 5\' 4"  (1.626 m)   Wt 148 lb 11.2 oz (67.4 kg)   LMP 08/22/2016 (Exact Date)   BMI 25.52 kg/m  CONSTITUTIONAL: Well-developed, well-nourished female in no acute distress.  HENT:  Normocephalic, atraumatic.  NECK:Not examined SKIN: Skin is warm and dry. No rash noted. Not diaphoretic. No erythema. No pallor. NEUROLGIC: Alert and oriented to person, place, and time. PSYCHIATRIC: Normal mood and affect. Normal behavior. Normal judgment and thought content. CARDIOVASCULAR:Not Examined RESPIRATORY: Not Examined BREASTS: Not Examined ABDOMEN: Soft, non distended; Non tender.  No Organomegaly. PELVIC:  External Genitalia: Medial aspect of labia minora bilaterally notable for white multiple inclusion cysts  BUS: Normal  Vagina: Normal  Cervix: Not examined  Uterus: Not examined  Adnexa: Not examined  RV: Normal external exam  Bladder: Nontender MUSCULOSKELETAL: Normal range of motion. No tenderness.  No cyanosis, clubbing, or edema. Lymph node survey: Negative inguinal nodes  PROCEDURE: Vulvar  biopsy Verbal consent is obtained. The perineum is cleansed with Betadine. Several cc of 1% lidocaine without epinephrine is injected to the left labia minora biopsy site. 3.0 punch biopsy is used to obtain specimen. One single suture of 3-0 Vicryl was placed for hemostasis. Blood loss is minimal. Complications were none. Procedure was well-tolerated. Specimen sent to pathology.  Assessment:   1. Vulvar lesion-Possibly consistent with vulvar inclusion cyst of labia minora bilaterally      Plan:   1. 3.0 mm punch biopsy left labia minora 2. Return in 1 week for follow-up  Herold HarmsMartin A Defrancesco, MD  Note: This dictation was prepared with Dragon dictation along with smaller phrase technology. Any transcriptional errors that result from this process are unintentional.

## 2016-09-07 ENCOUNTER — Telehealth: Payer: Self-pay | Admitting: Obstetrics and Gynecology

## 2016-09-07 NOTE — Telephone Encounter (Signed)
PT CALLED AND SHE WAS ON THE SCHEDULE FOR 1 WEEK VULAR BX RESULTS, PT DID NOT WANT TO WAIT UNTIL NEXT WEEK TO BE  SEEN, I DID NOT MAKE HER AN APPT BECAUSE THAT WAS THE REASON FOR THE APPT WAS TO GIVE HER RESULTS, SO IF YOU GIVE HER THOSE I DIDN'T KNOW IF SHE STILL NEEDED TO BE SEEN FOR THE SAME REASON, IF SHE DOES PLEASE FORWARD CALL BACK UP FRONT ANS WE CAN MAKE APPT.

## 2016-09-07 NOTE — Telephone Encounter (Signed)
Pt aware bx results not back yet. I will contact pt with results. Advised she will need to be seen for further planning per mad.

## 2016-09-09 ENCOUNTER — Encounter: Payer: Self-pay | Admitting: Obstetrics and Gynecology

## 2016-09-14 ENCOUNTER — Telehealth: Payer: Self-pay | Admitting: Obstetrics and Gynecology

## 2016-09-14 LAB — PATHOLOGY

## 2016-09-14 NOTE — Telephone Encounter (Signed)
Sent to [facility] for review. 

## 2016-09-14 NOTE — Telephone Encounter (Signed)
PT CALLED AND SHE WAS WAITING TO HERE BACK ABOUT SOME RESULTS AND ALSO NEEDING A STITCH LOOKED AT, I DID TEL L HER ABOUT DR DE BUT I WASN'T SURE IF SHE NEEDED TO SEE DR EVANS OR NOT THIS WEEK FOR THE RESULTS.

## 2016-09-14 NOTE — Telephone Encounter (Signed)
No evidence of malignancy. Possible lichen sclerosus versus vitiligo noted in pathologic discussion. Follow-up with Dr. Algis Downs

## 2016-09-15 NOTE — Telephone Encounter (Signed)
Pt aware of results. Appt made for 2/14 at 11:00.

## 2016-09-15 NOTE — Telephone Encounter (Signed)
lmtrc

## 2016-09-15 NOTE — Telephone Encounter (Signed)
See previous telephone encounter.

## 2016-10-07 ENCOUNTER — Encounter: Payer: Self-pay | Admitting: Obstetrics and Gynecology

## 2017-03-03 ENCOUNTER — Telehealth: Payer: Self-pay | Admitting: Family Medicine

## 2017-03-03 NOTE — Telephone Encounter (Signed)
Patient notified will try to get back with urgent care
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
## 2017-03-03 NOTE — Telephone Encounter (Signed)
She'll need an appt or should contact urgent care or return there; I can't help her over the phone for this for something I've not seen Thank you

## 2017-03-04 ENCOUNTER — Ambulatory Visit: Payer: Self-pay | Admitting: Family Medicine

## 2017-03-05 ENCOUNTER — Encounter: Payer: Self-pay | Admitting: Emergency Medicine

## 2017-03-05 ENCOUNTER — Ambulatory Visit
Admission: EM | Admit: 2017-03-05 | Discharge: 2017-03-05 | Disposition: A | Discharge status: Home / Self Care | Payer: BLUE CROSS/BLUE SHIELD | Attending: Family Medicine | Admitting: Family Medicine

## 2017-03-05 DIAGNOSIS — H9202 Otalgia, left ear: Secondary | ICD-10-CM | POA: Diagnosis not present

## 2017-03-05 DIAGNOSIS — H60502 Unspecified acute noninfective otitis externa, left ear: Secondary | ICD-10-CM | POA: Diagnosis not present

## 2017-03-05 MED ORDER — CIPROFLOXACIN-DEXAMETHASONE 0.3-0.1 % OT SUSP: 4.0000 [drp] | Freq: Two times a day (BID) | OTIC | 0 refills | Status: AC

## 2017-03-05 MED ORDER — OXYCODONE-ACETAMINOPHEN 5-325 MG PO TABS: 0.5000 | ORAL_TABLET | Freq: Three times a day (TID) | ORAL | 0 refills | Status: DC | PRN

## 2017-03-05 NOTE — Discharge Instructions (Signed)
Use medication as prescribed. Rest. Drink plenty of fluids. Keep ear dry.  Follow up with your primary care physician this week as needed. Return to Urgent care for new or worsening concerns.
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
## 2017-07-09 ENCOUNTER — Ambulatory Visit: Payer: Self-pay | Admitting: Family Medicine

## 2017-12-04 ENCOUNTER — Other Ambulatory Visit: Payer: Self-pay

## 2017-12-04 ENCOUNTER — Ambulatory Visit
Admission: EM | Admit: 2017-12-04 | Discharge: 2017-12-04 | Disposition: A | Discharge status: Home / Self Care | Payer: BLUE CROSS/BLUE SHIELD | Attending: Family Medicine | Admitting: Family Medicine

## 2017-12-04 DIAGNOSIS — J029 Acute pharyngitis, unspecified: Secondary | ICD-10-CM | POA: Diagnosis not present

## 2017-12-04 LAB — RAPID STREP SCREEN (MED CTR MEBANE ONLY): Streptococcus, Group A Screen (Direct): NEGATIVE

## 2017-12-04 MED ORDER — LIDOCAINE VISCOUS 2 % MT SOLN: OROMUCOSAL | 0 refills | Status: DC

## 2017-12-04 NOTE — ED Triage Notes (Signed)
Patient complains of sore throat, swollen throat x 3 days

## 2017-12-04 NOTE — ED Provider Notes (Signed)
[facility]    CSN: [id number] Arrival date & time: 12/04/17  1016     History   Chief Complaint Chief Complaint  Patient presents with  . Sore Throat    HPI Emma Edwards is a 22 y.o. female.   The history is provided by the patient.  Sore Throat  This is a new problem. The current episode started more than 2 days ago. The problem occurs constantly. The problem has not changed since onset.Pertinent negatives include no chest pain, no abdominal pain, no headaches and no shortness of breath.    Past Medical History:  Diagnosis Date  . Allergy   . Anxiety   . Headache(784.0) 2013  . Pilonidal cyst 2014    Patient Active Problem List   Diagnosis Date Noted  . Screen for STD (sexually transmitted disease) 06/24/2016  . Vaginal discharge 06/24/2016  . Labial lesion 06/24/2016  . Sebaceous cyst 09/09/2015  . Influenza vaccination declined by patient 05/10/2015  . Threatened abortion in first trimester 02/13/2015  . 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 Surveillance for birth control, oral contraceptives 02/07/2015  . History of kidney stones 02/07/2015  . History of migraine headaches 02/07/2015  . Menorrhagia with regular cycle 02/07/2015  . Mood changes 02/07/2015    History reviewed. No pertinent surgical history.  OB History    Gravida  1   Para      Term      Preterm      AB  1   Living        SAB  1   TAB      Ectopic      Multiple      Live Births               Home Medications    Prior to Admission medications   Medication Sig Start Date End Date Taking? Authorizing Provider  clindamycin (CLEOCIN) 300 MG capsule Take 300 mg by mouth 3 (three) times daily.    [provider]  lidocaine (XYLOCAINE) 2 % solution 20 ml gargle and spit q 6 hours prn 12/04/17   Payton Mccallum, MD  oxyCODONE-acetaminophen (ROXICET) 5-325 MG tablet Take 0.5 tablets by mouth every 8 (eight) hours as needed for moderate pain or  severe pain (Do not drive or operate heavy machinery while taking as can cause drowsiness.). 03/05/17   Renford Dills, NP    Family History Family History  Problem Relation Age of Onset  . Alcohol abuse Mother   . Depression Mother   . Drug abuse Mother   . Mental illness Mother   . Alcohol abuse Father   . Arthritis Father   . Depression Father   . Drug abuse Father   . Hypertension Father   . Hypertension Paternal Uncle   . Alcohol abuse Maternal Grandmother   . Depression Maternal Grandmother   . Drug abuse Maternal Grandmother   . Mental illness Maternal Grandmother   . Alcohol abuse Maternal Grandfather   . Drug abuse Maternal Grandfather   . Arthritis Paternal Grandmother   . Asthma Paternal Grandmother   . Cancer Paternal Grandmother   . COPD Paternal Grandmother   . Hyperlipidemia Paternal Grandmother     Social History Social History   Tobacco Use  . Smoking status: Former Smoker    Packs/day: 1.00    Years: 2.00    Pack years: 2.00    Last attempt to quit: 02/22/2016    Years since quitting: 1.7  .  Smokeless tobacco: Never Used  Substance Use Topics  . Alcohol use: No    Alcohol/week: 0.0 oz  . Drug use: No     Allergies   Penicillins   Review of Systems Review of Systems  Respiratory: Negative for shortness of breath.   Cardiovascular: Negative for chest pain.  Gastrointestinal: Negative for abdominal pain.  Neurological: Negative for headaches.     Physical Exam Triage Vital Signs ED Triage Vitals  Enc Vitals Group     BP 12/04/17 1032 119/71     Pulse Rate 12/04/17 1032 94     Resp 12/04/17 1032 18     Temp 12/04/17 1032 98.3 F (36.8 C)     Temp Source 12/04/17 1032 Oral     SpO2 12/04/17 1032 100 %     Weight 12/04/17 1030 160 lb (72.6 kg)     Height 12/04/17 1030 5\' 4"  (1.626 m)     Head Circumference --      Peak Flow --      Pain Score 12/04/17 1030 6     Pain Loc --      Pain Edu? --      Excl. in GC? --    No data  found.  Updated Vital Signs BP 119/71 (BP Location: Left Arm)   Pulse 94   Temp 98.3 F (36.8 C) (Oral)   Resp 18   Ht 5\' 4"  (1.626 m)   Wt 160 lb (72.6 kg)   LMP 11/22/2017   SpO2 100%   BMI 27.46 kg/m   Visual Acuity Right Eye Distance:   Left Eye Distance:   Bilateral Distance:    Right Eye Near:   Left Eye Near:    Bilateral Near:     Physical Exam  Constitutional: She appears well-developed and well-nourished. No distress.  HENT:  Head: Normocephalic and atraumatic.  Right Ear: Tympanic membrane, external ear and ear canal normal.  Left Ear: Tympanic membrane, external ear and ear canal normal.  Nose: Mucosal edema and rhinorrhea present. No nose lacerations, sinus tenderness, nasal deformity, septal deviation or nasal septal hematoma. No epistaxis.  No foreign bodies.  Mouth/Throat: Uvula is midline and mucous membranes are normal. Posterior oropharyngeal erythema present. No oropharyngeal exudate, posterior oropharyngeal edema or tonsillar abscesses. No tonsillar exudate.  Eyes: Conjunctivae are normal. Right eye exhibits no discharge. Left eye exhibits no discharge. No scleral icterus.  Neck: Normal range of motion. Neck supple. No thyromegaly present.  Cardiovascular: Normal rate, regular rhythm and normal heart sounds.  Pulmonary/Chest: Effort normal and breath sounds normal. No stridor. No respiratory distress. She has no wheezes. She has no rales.  Lymphadenopathy:    She has no cervical adenopathy.  Skin: She is not diaphoretic.  Nursing note and vitals reviewed.    UC Treatments / Results  Labs (all labs ordered are listed, but only abnormal results are displayed) Labs Reviewed  RAPID STREP SCREEN ([facility] ONLY)  CULTURE, GROUP A STREP [facility])    EKG None Radiology No results found.  Procedures Procedures (including critical care time)  Medications Ordered in UC Medications - No data to display   Initial Impression / Assessment and Plan /  UC Course  I have reviewed the triage vital signs and the nursing notes.  Pertinent labs & imaging results that were available during my care of the patient were reviewed by me and considered in my medical decision making (see chart for details).       Final  Clinical Impressions(s) / UC Diagnoses   Final diagnoses:  Viral pharyngitis    ED Discharge Orders        Ordered    lidocaine (XYLOCAINE) 2 % solution     12/04/17 1118     1. Lab results and diagnosis reviewed with patient 2. rx as per orders above; reviewed possible side effects, interactions, risks and benefits  3. Recommend supportive treatment with rest, fluids, otc analgesics prn 4. Follow-up prn if symptoms worsen or don't improve   Controlled Substance Prescriptions [place] Controlled Substance Registry consulted? Not Applicable   Payton Mccallumonty, Lem Peary, MD 12/04/17 1154

## 2017-12-06 ENCOUNTER — Telehealth (HOSPITAL_COMMUNITY): Payer: Self-pay

## 2017-12-06 LAB — CULTURE, GROUP A STREP (THRC)

## 2017-12-06 NOTE — Telephone Encounter (Signed)
Culture is positive for non group A Strep germ.  This is a finding of uncertain significance; not the typical 'strep throat' germ.  No answer at this time. Voicemail not set up.

## 2018-09-12 ENCOUNTER — Ambulatory Visit: Payer: Self-pay | Admitting: Nurse Practitioner

## 2018-09-12 ENCOUNTER — Encounter: Payer: Self-pay | Admitting: Nurse Practitioner

## 2018-09-12 VITALS — BP 106/70 | HR 87 | Temp 98.7°F | Resp 16 | Ht 64.0 in | Wt 152.6 lb

## 2018-09-12 DIAGNOSIS — K529 Noninfective gastroenteritis and colitis, unspecified: Secondary | ICD-10-CM

## 2018-09-12 NOTE — Patient Instructions (Signed)
Gastroenteritis (also known as stomach flu, although unrelated to influenza) is inflammation of the gastrointestinal tract, involving both the stomach and intestines. How did I get it? Gastroenteritis can have many causes, including viral or bacterial infections, medication reactions, food allergies, food/water poisoning or abuse of laxatives or alcohol. The duration and severity of the condition is relative to the illness. What are the symptoms? Symptoms can include fatigue, lack of appetite, abdominal growling and cramping, nausea, vomiting and/or diarrhea and are usually brief. Typically, no serious consequences occur and the condition resolves itself in a few days without medical treatment. How do I treat it? -Take nausea medicine as needed for nausea. If needed you can take loperamide (Imodium) for diarrhea, but avoid taking it for more than 2 days at a time as it can then lead to constipation.  -Drink fluids and get plenty of rest. Do not consume alcohol or caffeine. Avoid medications containing aspirin or ibuprofen, which may irritate your stomach, and do not take any medications by mouth unless directed by your medical care provider. 1. Drink clear liquids. Sip water/half-strength sports drinks or suck on ice chips. If you vomit using this treatment, do not take anything for 1 hour and start over again. 2. If you do not vomit fluids, you may progress to full-strength sports drinks; popsicles; clear broth; bouillon; decaf tea; clear apple juice; plain-flavored gelatin; and half-strength, clear, carbonated beverages without fizz (ginger ale, lemon-lime sodas, etc.). NOTE: To remove the fizz from soda, pour some into a glass and stir with a spoon. 3. As you become hungry, try moving to soft foods. Some examples include: saltine crackers, dry white bread/toast, bananas, apple sauce, plain white rice, soft cereals prepared with water, plain noodles and broth soups. Do not use sauces or condiments,  including butter. You may return to a normal diet as tolerated within 24 hours after recovery from vomiting. Recommended diets THINGS TO AVOID WHILE RECOVERING: . Alcohol  . Caffeine  . Dairy products  . Citrus products  . Fatty, greasy and/or fried foods  . Raw fruits and vegetables  . Aspirin  . Ibuprofen CLEAR LIQUID DIET: . Apple, grape or cranberry juice  . Kool-Aid  . Fruit punch  . Gatorade  . Ginger ale or 7UP  . Decaf tea  . Clear bouillon  . Jello  . Popsicles  . Fruit ice  . Salt FULL LIQUID DIET: . Cocoa  . Carbonated, decaf beverages  . Broth  . Strained, bland soups  . Cream of wheat or rice cereals  . [place]  . Vegetable juices  . Strained fruit juices or nectars  . Sherbets  . Honey  . Cinnamon  . Nutmeg  . Vanilla or other extracts CLEAR LIQUID DIET, PLUS: . Coffee  . White bread or toast  . Cooked or ready-to-eat cereal (no bran)  . Graham crackers  . Saltines  . Pasta or rice  . Soft, cooked vegetables  . Boiled or mashed potatoes  . Apple sauce  . Bananas or seedless melon  . Cooked or canned fruits  . Mild cheese or cottage cheese SOFT FULL LIQUID DIET, PLUS: . Soft-cooked, poached or hard-boiled or scrambled eggs  . Tender meat, fish or poultry  . Soft cake or cookies without nuts or raisins  . Butter, cream or margarine  . Jelly  SEEK MEDICAL TREATMENT IF: . You are unable to keep fluids down.  . You see blood or mucus in your stool.  . You vomit black   or dark red material.  . You have a fever of 101?F (38.33?C) or higher.  . You have localized and/or persistent abdominal pain.    

## 2018-09-12 NOTE — Progress Notes (Signed)
Name: Emma Edwards   MRN: [id number]    DOB: [birth year]   Date:09/12/2018       Progress Note  Subjective  Chief Complaint  Chief Complaint  Patient presents with  . Diarrhea    loose foul-smelling stools since yesterday morning  . Emesis  . Fever    none since 2 pm today - Otc ibuprofen  . Nausea    HPI  Patient states woke up yesterday morning at 2 am with abdominal cramping and had to use the bathroom- has had loose stools- not completely watery, no blood. Has has nausea and vomiting- 2 episodes in the last 2 4 hours. Fever yesterday that subsided after ibuprofen.  Some improvement today. Has been drinking lots of water, and herbals tea and smoothies. Urine- pale yellow.   Patient Active Problem List   Diagnosis Date Noted  . Screen for STD (sexually transmitted disease) 06/24/2016  . Vaginal discharge 06/24/2016  . Labial lesion 06/24/2016  . Sebaceous cyst 09/09/2015  . Influenza vaccination declined by patient 05/10/2015  . Threatened abortion in first trimester 02/13/2015  . 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 Surveillance for birth control, oral contraceptives 02/07/2015  . History of kidney stones 02/07/2015  . History of migraine headaches 02/07/2015  . Menorrhagia with regular cycle 02/07/2015  . Mood changes 02/07/2015    Past Medical History:  Diagnosis Date  . Allergy   . Anxiety   . Headache(784.0) 2013  . Pilonidal cyst 2014    History reviewed. No pertinent surgical history.  Social History   Tobacco Use  . Smoking status: Former Smoker    Packs/day: 1.00    Years: 2.00    Pack years: 2.00    Last attempt to quit: 02/22/2016    Years since quitting: 2.5  . Smokeless tobacco: Never Used  Substance Use Topics  . Alcohol use: No    Alcohol/week: 0.0 standard drinks     Current Outpatient Medications:  .  Ascorbic Acid (VITAMIN C PO), Take by mouth., Disp: , Rfl:  .  EVENING PRIMROSE OIL PO, Take by mouth., Disp: , Rfl:  .   Methylcobalamin (B12-ACTIVE PO), Take by mouth., Disp: , Rfl:  .  Omega-3 Fatty Acids (FISH OIL PO), Take by mouth., Disp: , Rfl:   Allergies  Allergen Reactions  . Amoxicillin Hives  . Penicillins Hives    ROS  No other specific complaints in a complete review of systems (except as listed in HPI above).  Objective  Vitals:   09/12/18 1614  BP: 106/70  Pulse: 87  Resp: 16  Temp: 98.7 F (37.1 C)  TempSrc: Oral  SpO2: 98%  Weight: 152 lb 9.6 oz (69.2 kg)  Height: 5\' 4"  (1.626 m)     Body mass index is 26.19 kg/m.  Nursing Note and Vital Signs reviewed.  Physical Exam Constitutional:      Appearance: Normal appearance.  HENT:     Head: Normocephalic and atraumatic.     Mouth/Throat:     Mouth: Mucous membranes are moist.  Cardiovascular:     Rate and Rhythm: Normal rate and regular rhythm.  Pulmonary:     Effort: Pulmonary effort is normal.     Breath sounds: Normal breath sounds.  Abdominal:     General: Abdomen is flat. Bowel sounds are normal.     Palpations: Abdomen is soft.     Tenderness: There is no abdominal tenderness.  Skin:    General: Skin is warm and dry.  Findings: No erythema.  Neurological:     General: No focal deficit present.     Mental Status: She is alert and oriented to person, place, and time.  Psychiatric:        Mood and Affect: Mood normal.        Thought Content: Thought content normal.       No results found for this or any previous visit (from the past 48 hour(s)).  Assessment & Plan  1. Gastroenteritis Improving, patient declined nausea medication, and blood work- do not think it is necessary as symptoms have much improved, no signs of dehydration or electrolyte imbalances.

## 2019-01-10 ENCOUNTER — Encounter: Payer: Self-pay | Admitting: Family Medicine

## 2019-01-10 ENCOUNTER — Ambulatory Visit (INDEPENDENT_AMBULATORY_CARE_PROVIDER_SITE_OTHER): Payer: Medicaid Other | Admitting: Family Medicine

## 2019-01-10 ENCOUNTER — Other Ambulatory Visit: Payer: Self-pay

## 2019-01-10 DIAGNOSIS — N3001 Acute cystitis with hematuria: Secondary | ICD-10-CM

## 2019-01-10 DIAGNOSIS — F39 Unspecified mood [affective] disorder: Secondary | ICD-10-CM

## 2019-01-10 MED ORDER — NITROFURANTOIN MONOHYD MACRO 100 MG PO CAPS: 100.0000 mg | ORAL_CAPSULE | Freq: Two times a day (BID) | ORAL | 0 refills | Status: AC

## 2019-01-10 NOTE — Patient Instructions (Signed)
Here are some resources to help you if you feel you are in a mental health crisis:  National Suicide Prevention Lifeline - Call 1-800-273-8255  for help - Website with more resources: https://suicidepreventionlifeline.org/  [facility] Mobile Crisis Program - Call 336-538-1220 for help. - Mobile Crisis Program available 24 hours a day, 365 days a year. - Available for anyone of any age in [place] & [place] counties.  [facility] - Address: [address], [place] - Telephone: [phone]  - Hours of Operation: Sunday - Saturday - 8:00 a.m. - 8:00 p.m. - Medicaid, Medicare (Government Issued Only), BCBS, and Cash - Pay - Crisis Management, Outpatient Individual & Group Therapy, Psychiatrists on-site to provide medication management, In-Home Psychiatric Care, and Peer Support Care.  [facility] - Call 1-877-626-1772 for help. - Mobile Crisis Program available 24 hours a day, 365 days a year. - Available for anyone of any age in [place] & [place]    

## 2019-01-10 NOTE — Progress Notes (Signed)
Name: Emma Edwards   MRN: [id number]    DOB: 1996/01/06   Date:01/10/2019       Progress Note  Subjective  Chief Complaint  Chief Complaint  Patient presents with   Urinary Tract Infection    frequency and burning with urination    I connected with  [name]  on 01/10/19 at 12:40 PM EDT by a video enabled telemedicine application and verified that I am speaking with the correct person using two identifiers.  I discussed the limitations of evaluation and management by telemedicine and the availability of in person appointments. The patient expressed understanding and agreed to proceed. Staff also discussed with the patient that there may be a patient responsible charge related to this service. Patient Location: Home Provider Location: Home Additional Individuals present: None  HPI  Pt presents with concern for UTI.  She is having urinary frequency, dysuria, lower central pubic discomfort for a few minutes after urination, some low back discomfort.  No hematuria, fevers/chills, flank pain, history of kidney stones, nausea or vomiting.  She does note a history of recurrent UTI many years ago. She was sexually active on Saturday and did not void afterwards.   +PHQ-9: She has an elevated PHQ-9 score today.  She notes history of depression her entire life, but no medication for tx; has had therapy in the past and this did help somewhat.  Denies SI/HI, no hallucinations; does have hx self harm (burning with lighters/stove) no self harm in about 5 years, but does have passive thoughts of this sometimes.  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 Notes she has tracked her mood for the last 6 months and has found the following -  - Will feel manic for about 10-14 days- increased sexual activity (sometimes inappropriate) increased energy and creativity, and will not eat much.  Then for about 10 days she levels out with a more even/stable mood,  then for the next 10--14 days she will feels very depressed and/or more reclusive, decreased energy.  She is not very interested in medication, but would like to see psychiatry.    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   Surveillance for birth control, oral contraceptives 02/07/2015   History of kidney stones 02/07/2015   History of migraine headaches 02/07/2015   Menorrhagia with regular cycle 02/07/2015   Mood changes 02/07/2015    Social History   Tobacco Use   Smoking status: Former Smoker    Packs/day: 1.00    Years: 2.00    Pack years: 2.00    Last attempt to quit: 02/22/2016    Years since quitting: 2.8   Smokeless tobacco: Never Used  Substance Use Topics   Alcohol use: No    Alcohol/week: 0.0 standard drinks     Current Outpatient Medications:    Ascorbic Acid (VITAMIN C PO), Take by mouth., Disp: , Rfl:    EVENING PRIMROSE OIL PO, Take by mouth., Disp: , Rfl:    Methylcobalamin (B12-ACTIVE PO), Take by mouth., Disp: , Rfl:    Omega-3  Fatty Acids (FISH OIL PO), Take by mouth., Disp: , Rfl:   Allergies  Allergen Reactions   Amoxicillin Hives   Penicillins Hives    I personally reviewed active problem list, medication list, allergies with the patient/caregiver today.  ROS  Constitutional: Negative for fever or weight change.  Respiratory: Negative for cough and shortness of breath.   Cardiovascular: Negative for chest pain or palpitations.  Gastrointestinal: Negative for abdominal pain, no bowel changes.  Musculoskeletal: Negative for gait  problem or joint swelling.  Skin: Negative for rash.  Neurological: Negative for dizziness or headache.  No other specific complaints in a complete review of systems (except as listed in HPI above).  Objective  Virtual encounter, vitals not obtained.  There is no height or weight on file to calculate BMI.  Nursing Note and Vital Signs reviewed.  Physical Exam  Constitutional: Patient appears well-developed and well-nourished. No distress.  HENT: Head: Normocephalic and atraumatic.  Neck: Normal range of motion. Pulmonary/Chest: Effort normal. No respiratory distress. Speaking in complete sentences Neurological: Pt is alert and oriented to person, place, and time. Coordination, speech and gait are normal.  Psychiatric: Patient has a normal mood and affect. behavior is normal. Judgment and thought content normal.   No results found for this or any previous visit (from the past 72 hour(s)).  Assessment & Plan  1. Acute cystitis with hematuria - nitrofurantoin, macrocrystal-monohydrate, (MACROBID) 100 MG capsule; Take 1 capsule (100 mg total) by mouth 2 (two) times daily for 5 days.  Dispense: 10 capsule; Refill: 0  2. Mood disorder (HCC) - Ambulatory referral to Psychiatry  -Red flags and when to present for emergency care or RTC including fever >101.12F, chest pain, shortness of breath, new/worsening/un-resolving symptoms, reviewed with patient at time of visit. Follow up and care instructions discussed and provided in AVS. - I discussed the assessment and treatment plan with the patient. The patient was provided an opportunity to ask questions and all were answered. The patient agreed with the plan and demonstrated an understanding of the instructions.  I provided 27 minutes of non-face-to-face time during this encounter.  Doren CustardEmily E Sher Hellinger, FNP

## 2019-08-24 ENCOUNTER — Encounter: Payer: Self-pay | Admitting: Family Medicine

## 2019-08-24 ENCOUNTER — Ambulatory Visit (INDEPENDENT_AMBULATORY_CARE_PROVIDER_SITE_OTHER): Payer: Medicaid Other | Admitting: Family Medicine

## 2019-08-24 ENCOUNTER — Other Ambulatory Visit: Payer: Self-pay

## 2019-08-24 VITALS — Ht 64.0 in | Wt 150.0 lb

## 2019-08-24 DIAGNOSIS — N3000 Acute cystitis without hematuria: Secondary | ICD-10-CM

## 2019-08-24 MED ORDER — CEPHALEXIN 500 MG PO CAPS: 500.0000 mg | ORAL_CAPSULE | Freq: Four times a day (QID) | ORAL | 0 refills | Status: AC

## 2019-08-24 NOTE — Progress Notes (Signed)
Name: Emma Edwards   MRN: [id number]    DOB: August 04, 1996   Date:08/24/2019       Progress Note  Subjective:    Chief Complaint  Chief Complaint  Patient presents with  . Urinary Tract Infection    Pt is covid +, onset for UTI 1 week, symptoms include: burning with urination and frequency    I connected with  Johny Sax  on 08/24/19 at 11:20 AM EST by a video enabled telemedicine application and verified that I am speaking with the correct person using two identifiers.  I discussed the limitations of evaluation and management by telemedicine and the availability of in person appointments. The patient expressed understanding and agreed to proceed. Staff also discussed with the patient that there may be a patient responsible charge related to this service. Patient Location: home Provider Location: [facility] clinic Additional Individuals present: none  Urinary Tract Infection  This is a recurrent problem. The current episode started in the past 7 days. The problem occurs every urination. The quality of the pain is described as burning. The pain is mild. There has been no fever. She is not sexually active. There is no history of pyelonephritis. Associated symptoms include frequency and urgency. Pertinent negatives include no chills, discharge, flank pain, hematuria, hesitancy, nausea, sweats or vomiting. She has tried increased fluids (azo) for the symptoms. The treatment provided no relief. Her past medical history is significant for recurrent UTIs. There is no history of catheterization, kidney stones, a single kidney, urinary stasis or a urological procedure.   Hasn't had a UTI in about 6 months, allergy to penicillin - rash rxn, denies anaphylaxis  Doesn't know last abx used  Patient Active Problem List   Diagnosis Date Noted  . Screen for STD (sexually transmitted disease) 06/24/2016  . Vaginal discharge 06/24/2016  . Labial lesion 06/24/2016  . Sebaceous cyst 09/09/2015  .  Influenza vaccination declined by patient 05/10/2015  . Threatened abortion in first trimester 02/13/2015  . 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 Surveillance for birth control, oral contraceptives 02/07/2015  . History of kidney stones 02/07/2015  . History of migraine headaches 02/07/2015  . Menorrhagia with regular cycle 02/07/2015  . Mood changes 02/07/2015    Social History   Tobacco Use  . Smoking status: Former Smoker    Packs/day: 1.00    Years: 2.00    Pack years: 2.00    Quit date: 02/22/2016    Years since quitting: 3.5  . Smokeless tobacco: Never Used  Substance Use Topics  . Alcohol use: No    Alcohol/week: 0.0 standard drinks     Current Outpatient Medications:  .  Ascorbic Acid (VITAMIN C PO), Take by mouth., Disp: , Rfl:  .  EVENING PRIMROSE OIL PO, Take by mouth., Disp: , Rfl:  .  Methylcobalamin (B12-ACTIVE PO), Take by mouth., Disp: , Rfl:  .  Omega-3 Fatty Acids (FISH OIL PO), Take by mouth., Disp: , Rfl:   Allergies  Allergen Reactions  . Amoxicillin Hives  . Penicillins Hives     I personally reviewed active problem list, medication list, allergies, family history, social history, health maintenance, notes from last encounter, lab results, imaging with the patient/caregiver today.  Review of Systems  Constitutional: Negative.  Negative for chills.  HENT: Negative.   Eyes: Negative.   Respiratory: Negative.   Cardiovascular: Negative.   Gastrointestinal: Negative.  Negative for nausea and vomiting.  Endocrine: Negative.   Genitourinary: Positive for frequency and urgency.  Negative for flank pain, hematuria and hesitancy.  Musculoskeletal: Negative.   Skin: Negative.   Allergic/Immunologic: Negative.   Neurological: Negative.   Hematological: Negative.   Psychiatric/Behavioral: Negative.   All other systems reviewed and are negative.     Objective:   Virtual encounter, vitals limited, only able to obtain the following Today's Vitals     08/24/19 0958  Weight: 150 lb (68 kg)  Height: 5\' 4"  (1.626 m)   Body mass index is 25.75 kg/m. Nursing Note and Vital Signs reviewed.  Physical Exam Vitals and nursing note reviewed.  Constitutional:      Appearance: She is well-developed.  HENT:     Head: Normocephalic and atraumatic.     Nose: Nose normal.  Eyes:     General:        Right eye: No discharge.        Left eye: No discharge.     Conjunctiva/sclera: Conjunctivae normal.  Neck:     Trachea: No tracheal deviation.  Cardiovascular:     Rate and Rhythm: Normal rate and regular rhythm.  Pulmonary:     Effort: Pulmonary effort is normal. No respiratory distress.     Breath sounds: No stridor.  Musculoskeletal:        General: Normal range of motion.  Skin:    General: Skin is warm and dry.     Findings: No rash.  Neurological:     Mental Status: She is alert.     Motor: No abnormal muscle tone.     Coordination: Coordination normal.  Psychiatric:        Behavior: Behavior normal.     PE limited by telephone encounter  No results found for this or any previous visit (from the past 72 hour(s)).  Assessment and Plan:     ICD-10-CM   1. Acute cystitis without hematuria  N30.00    clinically suspicious and consistent with simple UTI, tx with keflex, push fluids, azo/pyridium, f/up if not improving     -Red flags and when to present for emergency care or RTC including fever >101.16F, chest pain, shortness of breath, new/worsening/un-resolving symptoms,  reviewed with patient at time of visit. Follow up and care instructions discussed and provided in AVS. - I discussed the assessment and treatment plan with the patient. The patient was provided an opportunity to ask questions and all were answered. The patient agreed with the plan and demonstrated an understanding of the instructions.  I provided 12 minutes of non-face-to-face time during this encounter.  , PA-C 08/24/19 11:32 AM

## 2020-04-10 ENCOUNTER — Telehealth: Payer: Self-pay

## 2020-04-10 ENCOUNTER — Encounter: Payer: Medicaid Other | Admitting: Internal Medicine

## 2020-04-10 ENCOUNTER — Other Ambulatory Visit: Payer: Self-pay

## 2020-04-10 NOTE — Progress Notes (Signed)
Error, no visit occurred

## 2020-04-10 NOTE — Telephone Encounter (Signed)
Patient scheduled for 1020 telephone visit with Dr. Dorris Fetch.  First attempt to triage was at 0927, no answer, no voicemail available. Second attempt to triage 0945 no answer, no voicemail Third attempt 1000, no answer, no voicemail 4th attempt to reach patient was sent straight to VM.   Patient will need to reschedule

## 2020-05-14 ENCOUNTER — Ambulatory Visit: Payer: Self-pay

## 2020-05-14 NOTE — Telephone Encounter (Signed)
Patient called and says she has a fever since this morning. She says her temp today is 99.3. She says other symptoms she has-sore throat, sinus drainage, body aches. I asked has she been exposed to anyone sick. She says everyone she's been around has been fully vaccinated and she's partially vaccinated. I advised she may need to get COVID tested, she says she will call [facility] to schedule. She has a virtual appointment tomorrow at [phone] with Dr. Dorris Fetch.  Reason for Disposition . Care advice for fever, questions about  Answer Assessment - Initial Assessment Questions 1. TEMPERATURE: "What is the most recent temperature?"  "How was it measured?"      99.3 oral thermometer 2. ONSET: "When did the fever start?"      This morning 3. SYMPTOMS: "Do you have any other symptoms besides the fever?"  (e.g., colds, headache, sore throat, earache, cough, rash, diarrhea, vomiting, abdominal pain)     Body aches, headache, stuffy nose, sore throat 4. CAUSE: If there are no symptoms, ask: "What do you think is causing the fever?"       I don't know 5. CONTACTS: "Does anyone else in the family have an infection?"     No 6. TREATMENT: "What have you done so far to treat this fever?" (e.g., medications)     Ibuprofen, herbal tea 7. IMMUNOCOMPROMISE: "Do you have of the following: diabetes, HIV positive, splenectomy, cancer chemotherapy, chronic steroid treatment, transplant patient, etc."     No 8. PREGNANCY: "Is there any chance you are pregnant?" "When was your last menstrual period?"     No. LMP 04/23/20 9. TRAVEL: "Have you traveled out of the country in the last month?" (e.g., travel history, exposures)     No travel  Answer Assessment - Initial Assessment Questions 1. ONSET: "When did the nasal discharge start?"      N/A 2. AMOUNT: "How much discharge is there?"      Draining down throat, nose not runny 3. COUGH: "Do you have a cough?" If yes, ask: "Describe the color of your sputum" (clear,  white, yellow, green)     No  4. RESPIRATORY DISTRESS: "Describe your breathing."      N/A 5. FEVER: "Do you have a fever?" If Yes, ask: "What is your temperature, how was it measured, and when did it start?"     99.3 orally after taking ibuprofen, started this morning 6. SEVERITY: "Overall, how bad are you feeling right now?" (e.g., doesn't interfere with normal activities, staying home from school/work, staying in bed)      N/A 7. OTHER SYMPTOMS: "Do you have any other symptoms?" (e.g., sore throat, earache, wheezing, vomiting)     Sore throat 8. PREGNANCY: "Is there any chance you are pregnant?" "When was your last menstrual period?"     N/A  Protocols used: COMMON COLD-A-AH, FEVER-A-AH

## 2020-05-14 NOTE — Progress Notes (Signed)
Name: Emma Edwards   MRN: [id number]    DOB: May 25, 1996   Date:05/15/2020       Progress Note  Subjective  Chief Complaint  Chief Complaint  Patient presents with  . Fever    Onset Monday 05/13/20, will be tested today at 11:00  . Sore Throat  . Headache  . Generalized Body Aches    I connected with  Emma Edwards on 05/15/20 at  7:40 AM EDT by telephone and verified that I am speaking with the correct person using two identifiers.  I discussed the limitations, risks, security and privacy concerns of performing an evaluation and management service by telephone and the availability of in person appointments. The patient expressed understanding and agreed to proceed. Staff also discussed with the patient that there may be a patient responsible charge related to this service. Patient Location: Home Provider Location: [facility] Additional Individuals present: none  HPI Patient is a 24 year old female patient of Emma Edwards She was diagnosed with Covid in January 202, "this feels like the flu to me",not as fatigued as when had covid Did have the vaccine, 2nd dose October 3rd. Follows up today with a phone visit with the above complaints  Her sx's started Mon 05/13/20 No real cough, minimal, no marked production No marked SOB + fever, last night 101, not feeling feverish so far this morning + sore throat +mild congestion - sinus, not in the chest, pressure in the nasal area minimal loss of smell, loss of taste and noted had Covid in January and lost taste and smell and not come back completely  No N/V + muscle aches No marked loose stools/diarrhea No CP,  passing out episodes Taking dayquil/Nyquil, Vit C, teas,   Comorbid conditions reviewed No asthma/COPD hx,  No h/o DM, heart disease, CKD, morbid obesity + psoriasis - not on meds for this Wt Readings from Last 3 Encounters:  08/24/19 150 lb (68 kg)  09/12/18 152 lb 9.6 oz (69.2 kg)  12/04/17 160 lb (72.6 kg)    Tob - quit 6 months ago  Has covid test scheduled at 11 today, PCR test at a [facility]  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   Patient Active Problem List   Diagnosis Date Noted  . Screen for STD (sexually transmitted disease) 06/24/2016  . Vaginal discharge 06/24/2016  . Labial lesion 06/24/2016  . Sebaceous cyst 09/09/2015  . Influenza vaccination declined by patient 05/10/2015  . Threatened abortion in first trimester 02/13/2015  . 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 Surveillance for birth control, oral contraceptives 02/07/2015  . History of kidney stones 02/07/2015  . History of migraine headaches 02/07/2015  . Menorrhagia with regular cycle 02/07/2015  . Mood changes 02/07/2015    No past surgical history on file.  Family History  Problem Relation Age of Onset  . Alcohol abuse Mother   . Depression Mother   . Drug abuse Mother   . Mental illness Mother   . Alcohol abuse Father   . Arthritis Father   . Depression Father   . Drug abuse Father   . Hypertension Father   . Hypertension Paternal Uncle   . Alcohol abuse Maternal Grandmother   . Depression Maternal Grandmother   . Drug abuse Maternal Grandmother   . Mental illness Maternal Grandmother   . Alcohol abuse Maternal Grandfather   . Drug abuse Maternal Grandfather   .  Arthritis Paternal Grandmother   . Asthma Paternal Grandmother   . Cancer Paternal Grandmother   . COPD Paternal Grandmother   . Hyperlipidemia Paternal Grandmother     Social History   Tobacco Use  . Smoking status: Former Smoker    Packs/day: 1.00    Years: 2.00    Pack years: 2.00    Quit date: 02/22/2016    Years since quitting: 4.2  . Smokeless tobacco: Never Used  Substance Use Topics  . Alcohol use: No    Alcohol/week: 0.0 standard drinks     Current Outpatient Medications:  .  Ascorbic Acid (VITAMIN C PO), Take by mouth., Disp: , Rfl:  .   Methylcobalamin (B12-ACTIVE PO), Take by mouth., Disp: , Rfl:   Allergies  Allergen Reactions  . Amoxicillin Hives  . Penicillins Hives    With staff assistance, above reviewed with the patient today.  ROS: As per HPI, otherwise no specific complaints on a limited and focused system review   Objective  Virtual encounter, vitals not obtained.  There is no height or weight on file to calculate BMI.  Physical Exam   Appears in NAD via conversation, pleasant  Breathing: No obvious respiratory distress. Speaking in complete sentences Neurological: Pt is alert, Speech is normal Psychiatric: Patient has a normal mood and affect, behavior is normal. Judgment and thought content normal.   No results found for this or any previous visit (from the past 72 hour(s)).  PHQ2/9: Depression screen [facility] 2/9 05/15/2020 08/24/2019 01/10/2019 09/12/2018 06/24/2016  Decreased Interest 0 3 0 1 0  Down, Depressed, Hopeless 0 2 2 1  0  PHQ - 2 Score 0 5 2 2  0  Altered sleeping - 1 2 1  -  Tired, decreased energy - 3 2 1  -  Change in appetite - 1 2 1  -  Feeling bad or failure about yourself  - 1 2 0 -  Trouble concentrating - 1 2 0 -  Moving slowly or fidgety/restless - 0 0 1 -  Suicidal thoughts - 1 0 0 -  PHQ-9 Score - 13 12 6  -  Difficult doing work/chores - Somewhat difficult Somewhat difficult Somewhat difficult -   PHQ-2/9 Result reviewed  Fall Risk: Fall Risk  05/15/2020 08/24/2019 01/10/2019 09/12/2018 06/24/2016  Falls in the past year? 0 1 0 1 No  Number falls in past yr: 0 1 0 1 -  Injury with Fall? 0 0 0 0 -  Risk for fall due to : - - - History of fall(s) -  Follow up Falls evaluation completed - - - -     Assessment & Plan   URI - possibly viral/Covid discussed Discussed with patient that this is likely an upper respiratory infection, could be viral, and the possibility of Covid exists.  She notes she does not feel near as symptomatic as when she had the Covid previously.  She is  partially vaccinated presently as well as having the actual Covid infection prior can contribute to milder symptoms if does recur.  Noted some of the limitations with this being a phone visit. Discussed the best approach, continuing to manage symptomatically Recommend symptomatic measures - stay well hydrated, rest, and continue the DayQuil/NyQuil combination as it seems to be helpful for her, and tylenol prn for any low grade temps/fevers/aches Also can continue with tea products as that is helpful, and I noted honey can also be helpful with in addition She is getting Covid tested today, and await that result.  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  She noted that will not be a problem. If the Covid test does return positive, should remain isolated for at least 10 days after symptom onset, and she should not return until she has no fevers off of fever reducing medicines for approximately 48 hours and her symptoms have pretty well resolved. She had a court date yesterday, and requested a note for that, and will send one via MyChart stating she was assessed today, and needs to remain isolated until her symptoms are significantly resolving.  If sx's worsening acutely, such as increased SOB, fevers, weakness or other concerning sx's arise, should f/u and may need to be seen more emergently in the ER for a more immediate evaluation, and patient was understanding of this.   I discussed the assessment and treatment plan with the patient. The patient was provided an opportunity to ask questions and all were answered. The patient agreed with the plan and demonstrated an understanding of the instructions.  Red flags and when to present for emergency care or RTC including fevers, chest pain, shortness of breath, new/worsening/un-resolving symptoms reviewed with patient at time of visit.   The patient was  advised to call back or seek an in-person evaluation if the symptoms worsen or if the condition fails to improve as anticipated.  I provided 15 minutes of non-face-to-face time during this encounter that included discussing at length patient's sx/history, pertinent pmhx, medications, treatment and follow up plan. This time also included the necessary documentation, orders, and chart review.  Jamelle Haring, MD

## 2020-05-15 ENCOUNTER — Encounter: Payer: Self-pay | Admitting: Internal Medicine

## 2020-05-15 ENCOUNTER — Ambulatory Visit (INDEPENDENT_AMBULATORY_CARE_PROVIDER_SITE_OTHER): Payer: Self-pay | Admitting: Internal Medicine

## 2020-05-15 DIAGNOSIS — J069 Acute upper respiratory infection, unspecified: Secondary | ICD-10-CM

## 2020-06-17 ENCOUNTER — Ambulatory Visit: Payer: Medicaid Other | Admitting: Family Medicine

## 2020-07-01 ENCOUNTER — Ambulatory Visit: Payer: Medicaid Other | Admitting: Family Medicine

## 2020-10-28 ENCOUNTER — Ambulatory Visit: Payer: Self-pay

## 2020-11-04 ENCOUNTER — Ambulatory Visit: Payer: Self-pay

## 2020-11-06 ENCOUNTER — Ambulatory Visit: Payer: Self-pay

## 2021-01-14 ENCOUNTER — Ambulatory Visit: Payer: Medicaid Other | Admitting: Family Medicine

## 2021-03-28 ENCOUNTER — Ambulatory Visit
Admission: EM | Admit: 2021-03-28 | Discharge: 2021-03-28 | Disposition: A | Discharge status: Home / Self Care | Payer: BLUE CROSS/BLUE SHIELD

## 2021-03-28 ENCOUNTER — Other Ambulatory Visit: Payer: Self-pay

## 2021-03-28 DIAGNOSIS — R059 Cough, unspecified: Secondary | ICD-10-CM | POA: Insufficient documentation

## 2021-03-28 DIAGNOSIS — J029 Acute pharyngitis, unspecified: Secondary | ICD-10-CM | POA: Insufficient documentation

## 2021-03-28 DIAGNOSIS — Z20822 Contact with and (suspected) exposure to covid-19: Secondary | ICD-10-CM | POA: Insufficient documentation

## 2021-03-28 DIAGNOSIS — B349 Viral infection, unspecified: Secondary | ICD-10-CM | POA: Insufficient documentation

## 2021-03-28 LAB — GROUP A STREP BY PCR: Group A Strep by PCR: NOT DETECTED

## 2021-03-28 NOTE — ED Provider Notes (Signed)
[facility]    CSN: [id number] Arrival date & time: 03/28/21  1254      History   Chief Complaint Chief Complaint  Patient presents with   Cough   Nasal Congestion    HPI Emma Edwards is a 25 y.o. female presenting with feeling feverish and temps up to 99.6 degrees, cough, sore throat, nasal congestion and feeling short of breath since yesterday.  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  Patient reports personal history of COVID-19 about 3 months ago.  She also admits to history of COVID-19 at the end of 2020/beginning of 2021.  Vaccinated for COVID-19 x1.  Patient denies any history of cardiopulmonary disease but does report history of anxiety and states she gets short of breath at times when she gets anxious.  Patient currently anxious about current symptoms.  States she gets a little short of breath on exertion.  Denies any high-grade fever, chest pain, diarrhea, vomiting.  Not currently taking any OTC meds other than ibuprofen.  States her boyfriend picked her up some decongestants and she is going to take this later.  No other complaints or concerns.  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 No pertinent surgical history.  OB History     Gravida  1   Para       Term      Preterm      AB  1   Living         SAB  1   IAB      Ectopic      Multiple      Live Births               Home Medications    Prior to Admission medications   Medication Sig Start Date End Date Taking? Authorizing Provider  albuterol (VENTOLIN HFA) 108 (90 Base) MCG/ACT inhaler Inhale into the lungs. 01/02/21  Yes [provider]  Ascorbic Acid (VITAMIN C PO) Take by mouth.   Yes [provider]  diazepam (VALIUM) 5 MG tablet Take 5 mg by mouth as needed. 03/11/21  Yes [provider]  ESTARYLLA 0.25-35 MG-MCG tablet Take 1 tablet by mouth daily. 03/14/21  Yes [provider]  hydrOXYzine (ATARAX/VISTARIL) 25 MG tablet Take 25 mg by mouth every 6 (six) hours as needed. 03/11/21  Yes [provider]  Methylcobalamin (B12-ACTIVE PO) Take by mouth.   Yes [provider]  nitrofurantoin, macrocrystal-monohydrate, (MACROBID) 100 MG capsule Take 100 mg by mouth every 12 (twelve) hours. 03/24/21  Yes [provider]  promethazine (PHENERGAN) 25 MG tablet promethazine 25 mg tablet  TAKE 1 TABLET BY MOUTH EVERY 4 TO 6 HOURS AS NEEDED FOR NAUSEA   Yes [provider]  zolpidem (AMBIEN) 10 MG  tablet Take 10 mg by mouth at bedtime as needed. 02/28/21  Yes [provider]    Family History Family History  Problem Relation Age of Onset   Alcohol abuse Mother    Depression Mother    Drug abuse Mother    Mental illness Mother    Alcohol abuse Father    Arthritis Father    Depression Father    Drug abuse Father    Hypertension Father    Hypertension Paternal Uncle    Alcohol abuse Maternal Grandmother    Depression Maternal Grandmother    Drug abuse Maternal Grandmother    Mental illness Maternal Grandmother    Alcohol abuse Maternal Grandfather    Drug abuse Maternal Grandfather    Arthritis Paternal Grandmother    Asthma Paternal Grandmother    Cancer Paternal Grandmother    COPD  Paternal Grandmother    Hyperlipidemia Paternal Grandmother     Social History Social History   Tobacco Use   Smoking status: Former    Packs/day: 1.00    Years: 2.00    Pack years: 2.00    Types: Cigarettes    Quit date: 02/22/2016    Years since quitting: 5.0   Smokeless tobacco: Never  Vaping Use   Vaping Use: Never used  Substance Use Topics   Alcohol use: No    Alcohol/week: 0.0 standard drinks   Drug use: No     Allergies   Amoxicillin and Penicillins   Review of Systems Review of Systems  Constitutional:  Negative for chills, diaphoresis, fatigue and fever.  HENT:  Positive for congestion, rhinorrhea and sore throat. Negative for ear pain, sinus pressure and sinus pain.   Respiratory:  Positive for cough and shortness of breath.   Cardiovascular:  Negative for chest pain.  Gastrointestinal:  Negative for abdominal pain, nausea and vomiting.  Musculoskeletal:  Negative for arthralgias and myalgias.  Skin:  Negative for rash.  Neurological:  Negative for weakness and headaches.  Hematological:  Negative for adenopathy.    Physical Exam Triage Vital Signs ED Triage Vitals  Enc Vitals Group     BP 03/28/21 1315 120/73     Pulse Rate 03/28/21 1315 (!) 102     Resp 03/28/21 1315 18     Temp 03/28/21 1315 98.4 F (36.9 C)     Temp Source 03/28/21 1315 Oral     SpO2 03/28/21 1315 99 %     Weight 03/28/21 1310 165 lb (74.8 kg)     Height 03/28/21 1310  (1.626 m)     Head Circumference --      Peak Flow --      Pain Score 03/28/21 1310 6     Pain Loc --      Pain Edu? --      Excl. in GC? --    No data found.  Updated Vital Signs BP 120/73 (BP Location: Left Arm)   Pulse (!) 102   Temp 98.4 F (36.9 C) (Oral)   Resp 18   Ht  (1.626 m)   Wt 165 lb (74.8 kg)   LMP 03/13/2021   SpO2 99%   BMI 28.32 kg/m       Physical Exam Vitals and nursing note reviewed.  Constitutional:      General: She is not in acute distress.    Appearance:  Normal appearance. She is not ill-appearing or toxic-appearing.  HENT:     Head: Normocephalic and atraumatic.  Nose: Congestion present.     Mouth/Throat:     Mouth: Mucous membranes are moist.     Pharynx: Oropharynx is clear. Posterior oropharyngeal erythema present.  Eyes:     General: No scleral icterus.       Right eye: No discharge.        Left eye: No discharge.     Conjunctiva/sclera: Conjunctivae normal.  Cardiovascular:     Rate and Rhythm: Normal rate and regular rhythm.     Heart sounds: Normal heart sounds.  Pulmonary:     Effort: Pulmonary effort is normal. No respiratory distress.     Breath sounds: Normal breath sounds. No wheezing, rhonchi or rales.  Musculoskeletal:     Cervical back: Neck supple.  Skin:    General: Skin is dry.  Neurological:     General: No focal deficit present.     Mental Status: She is alert. Mental status is at baseline.     Motor: No weakness.     Gait: Gait normal.  Psychiatric:        Mood and Affect: Mood normal.        Behavior: Behavior normal.        Thought Content: Thought content normal.     UC Treatments / Results  Labs (all labs ordered are listed, but only abnormal results are displayed) Labs Reviewed  SARS CORONAVIRUS 2 (TAT 6-24 HRS)  GROUP A STREP BY PCR    EKG   Radiology No results found.  Procedures Procedures (including critical care time)  Medications Ordered in UC Medications - No data to display  Initial Impression / Assessment and Plan / UC Course  I have reviewed the triage vital signs and the nursing notes.  Pertinent labs & imaging results that were available during my care of the patient were reviewed by me and considered in my medical decision making (see chart for details).  25 year old female presenting for onset of cough, sore throat, nasal congestion, elevated temperatures, nausea and shortness of breath yesterday.  VSS and patient overall well-appearing.  Exam significant for  minor nasal congestion and mild posterior frontal erythema.  Chest is clear to auscultation heart regular rate and rhythm.  Advised patient that her vitals are normal and stable and her chest is clear.  She states this immediately eased her anxiety which she believes has helped her shortness of breath.  Patient with strep test negative.  COVID test performed.  Current CDC guidelines, isolation protocol and ED precautions reviewed with patient.  Patient reports history of COVID-19 about 3 months ago.  Doubtful that she has COVID-19 but she states she feels similar to when she had it so she wants to be tested again.  Advised her she likely has another viral illness.  Supportive care encouraged with increasing rest and fluids.  Patient says she already has decongestants.  Advised her to take her decongestants and rest.  Follow-up with us as needed.   Final Clinical Impressions(s) / UC Diagnoses   Final diagnoses:  Viral illness  Cough  Sore throat     Discharge Instructions      URI/COLD SYMPTOMS: Your exam today is consistent with a viral illness. Antibiotics are not indicated at this time. Use medications as directed, including cough syrup, nasal saline, and decongestants. Your symptoms should improve over the next few days and resolve within 7-10 days. Increase rest and fluids. F/u if symptoms worsen or predominate such as sore throat, ear pain, productive cough, shortness of  breath, or if you develop high fevers or worsening fatigue over the next several days.    You have received COVID testing today either for positive exposure, concerning symptoms that could be related to COVID infection, screening purposes, or re-testing after confirmed positive.  Your test obtained today checks for active viral infection in the last 1-2 weeks. If your test is negative now, you can still test positive later. So, if you do develop symptoms you should either get re-tested and/or isolate x 5 days and then  strict mask use x 5 days (unvaccinated) or mask use x 10 days (vaccinated). Please follow CDC guidelines.  While Rapid antigen tests come back in 15-20 minutes, send out PCR/molecular test results typically come back within 1-3 days. In the mean time, if you are symptomatic, assume this could be a positive test and treat/monitor yourself as if you do have COVID.   We will call with test results if positive. Please download the MyChart app and set up a profile to access test results.   If symptomatic, go home and rest. Push fluids. Take Tylenol as needed for discomfort. Gargle warm salt water. Throat lozenges. Take Mucinex DM or Robitussin for cough. Humidifier in bedroom to ease coughing. Warm showers. Also review the COVID handout for more information.  COVID-19 INFECTION: The incubation period of COVID-19 is approximately 14 days after exposure, with most symptoms developing in roughly 4-5 days. Symptoms may range in severity from mild to critically severe. Roughly 80% of those infected will have mild symptoms. People of any age may become infected with COVID-19 and have the ability to transmit the virus. The most common symptoms include: fever, fatigue, cough, body aches, headaches, sore throat, nasal congestion, shortness of breath, nausea, vomiting, diarrhea, changes in smell and/or taste.    COURSE OF ILLNESS Some patients may begin with mild disease which can progress quickly into critical symptoms. If your symptoms are worsening please call ahead to the Emergency Department and proceed there for further treatment. Recovery time appears to be roughly 1-2 weeks for mild symptoms and 3-6 weeks for severe disease.   GO IMMEDIATELY TO ER FOR FEVER YOU ARE UNABLE TO GET DOWN WITH TYLENOL, BREATHING PROBLEMS, CHEST PAIN, FATIGUE, LETHARGY, INABILITY TO EAT OR DRINK, ETC  QUARANTINE AND ISOLATION: To help decrease the spread of COVID-19 please remain isolated if you have COVID infection or are highly  suspected to have COVID infection. This means -stay home and isolate to one room in the home if you live with others. Do not share a bed or bathroom with others while ill, sanitize and wipe down all countertops and keep common areas clean and disinfected. Stay home for 5 days. If you have no symptoms or your symptoms are resolving after 5 days, you can leave your house. Continue to wear a mask around others for 5 additional days. If you have been in close contact (within 6 feet) of someone diagnosed with COVID 19, you are advised to quarantine in your home for 14 days as symptoms can develop anywhere from 2-14 days after exposure to the virus. If you develop symptoms, you  must isolate.  Most current guidelines for COVID after exposure -unvaccinated: isolate 5 days and strict mask use x 5 days. Test on day 5 is possible -vaccinated: wear mask x 10 days if symptoms do not develop -You do not necessarily need to be tested for COVID if you have + exposure and  develop symptoms. Just isolate at home x10  days from symptom onset During this global pandemic, CDC advises to practice social distancing, try to stay at least 43ft away from others at all times. Wear a face covering. Wash and sanitize your hands regularly and avoid going anywhere that is not necessary.  KEEP IN MIND THAT THE COVID TEST IS NOT 100% ACCURATE AND YOU SHOULD STILL DO EVERYTHING TO PREVENT POTENTIAL SPREAD OF VIRUS TO OTHERS (WEAR MASK, WEAR GLOVES, WASH HANDS AND SANITIZE REGULARLY). IF INITIAL TEST IS NEGATIVE, THIS MAY NOT MEAN YOU ARE DEFINITELY NEGATIVE. MOST ACCURATE TESTING IS DONE 5-7 DAYS AFTER EXPOSURE.   It is not advised by CDC to get re-tested after receiving a positive COVID test since you can still test positive for weeks to months after you have already cleared the virus.   *If you have not been vaccinated for COVID, I strongly suggest you consider getting vaccinated as long as there are no contraindications.       ED  Prescriptions   None    PDMP not reviewed this encounter.   Shirlee Latch, PA-C 03/28/21 [phone]

## 2021-03-28 NOTE — Discharge Instructions (Addendum)

## 2021-03-28 NOTE — ED Triage Notes (Signed)
Pt c/o cough, sore throat, nausea, elevated temp (99.6), nasal congestion and shob since yesterday. Pt states she taken 2 at-home COVID tests and they were both negative. Pt states she is currently on Macrobid for a UTI.

## 2021-03-29 LAB — SARS CORONAVIRUS 2 (TAT 6-24 HRS): SARS Coronavirus 2: NEGATIVE

## 2021-09-16 ENCOUNTER — Ambulatory Visit
Admission: EM | Admit: 2021-09-16 | Discharge: 2021-09-16 | Disposition: A | Discharge status: Home / Self Care | Payer: Self-pay

## 2021-09-16 ENCOUNTER — Other Ambulatory Visit: Payer: Self-pay

## 2021-09-17 ENCOUNTER — Encounter: Payer: Self-pay | Admitting: Nurse Practitioner

## 2021-09-17 ENCOUNTER — Other Ambulatory Visit: Payer: Self-pay

## 2021-09-17 ENCOUNTER — Ambulatory Visit: Payer: Self-pay | Admitting: Nurse Practitioner

## 2021-09-17 VITALS — BP 114/76 | HR 100 | Temp 98.4°F | Resp 18 | Ht 64.0 in | Wt 166.2 lb

## 2021-09-17 DIAGNOSIS — L409 Psoriasis, unspecified: Secondary | ICD-10-CM

## 2021-09-17 DIAGNOSIS — M25561 Pain in right knee: Secondary | ICD-10-CM

## 2021-09-17 DIAGNOSIS — R350 Frequency of micturition: Secondary | ICD-10-CM

## 2021-09-17 DIAGNOSIS — M79641 Pain in right hand: Secondary | ICD-10-CM

## 2021-09-17 DIAGNOSIS — G8929 Other chronic pain: Secondary | ICD-10-CM

## 2021-09-17 DIAGNOSIS — M25562 Pain in left knee: Secondary | ICD-10-CM

## 2021-09-17 DIAGNOSIS — M25571 Pain in right ankle and joints of right foot: Secondary | ICD-10-CM

## 2021-09-17 DIAGNOSIS — M25572 Pain in left ankle and joints of left foot: Secondary | ICD-10-CM

## 2021-09-17 DIAGNOSIS — M79642 Pain in left hand: Secondary | ICD-10-CM

## 2021-09-17 LAB — POCT URINALYSIS DIPSTICK
Bilirubin, UA: NEGATIVE
Glucose, UA: NEGATIVE
Ketones, UA: NEGATIVE
Nitrite, UA: NEGATIVE
Protein, UA: POSITIVE — AB
Spec Grav, UA: 1.02 (ref 1.010–1.025)
Urobilinogen, UA: NEGATIVE E.U./dL — AB
pH, UA: 6 (ref 5.0–8.0)

## 2021-09-17 MED ORDER — NITROFURANTOIN MONOHYD MACRO 100 MG PO CAPS: 100.0000 mg | ORAL_CAPSULE | Freq: Two times a day (BID) | ORAL | 0 refills | Status: AC

## 2021-09-17 NOTE — Progress Notes (Addendum)
BP 114/76    Pulse 100    Temp 98.4 F (36.9 C) (Oral)    Resp 18    Ht 5\' 4"  (1.626 m)    Wt 166 lb 3.2 oz (75.4 kg)    LMP 08/25/2021    SpO2 99%    BMI 28.53 kg/m    Subjective:    Patient ID: 10/23/2021, female    DOB: 02-13-1996, 26 y.o.   MRN: [id number]  HPI: Emma Edwards is a 26 y.o. female  Chief Complaint  Patient presents with   Urinary Tract Infection    Recurrent burning and frequency   UTI:  Recently had a UTI on January 3, had virtual visit with [facility] and was treated with Macrobid.  Symptoms improved and then returned yesterday.  Symptoms include dysuria, frequency, suprapubic pain.  She denies any fever or back pain.  She says that she was told that she needed to see urology because she has had recurrent urinary tract infections and needed to have her urethra stretched.    Hands, knees and ankles pain:  She says she has been having pain for six years.  She says the pain is constant but intensity changes. She says that they do swell sometimes.  She says she takes ibuprofen for pain relief.  She denies any injury.  She says she has a history of psoriasis and is concerned that she is developing psoriasis arthritis.  Discussed doing labs to check ANA, and RF. She declined and said she wants a referral to rheumatology.  Referral placed.   Relevant past medical, surgical, family and social history reviewed and updated as indicated. Interim medical history since our last visit reviewed. Allergies and medications reviewed and updated.  Review of Systems  Constitutional: Negative for fever or weight change.  Respiratory: Negative for cough and shortness of breath.   Cardiovascular: Negative for chest pain or palpitations.  Gastrointestinal: Negative for abdominal pain, no bowel changes.  Musculoskeletal: Negative for gait problem, positive for joint pain and swelling.  Skin: Negative for rash.  Neurological: Negative for dizziness or headache.  No other specific  complaints in a complete review of systems (except as listed in HPI above).      Objective:    BP 114/76    Pulse 100    Temp 98.4 F (36.9 C) (Oral)    Resp 18    Ht 5\' 4"  (1.626 m)    Wt 166 lb 3.2 oz (75.4 kg)    LMP 08/25/2021    SpO2 99%    BMI 28.53 kg/m   Wt Readings from Last 3 Encounters:  09/17/21 166 lb 3.2 oz (75.4 kg)  03/28/21 165 lb (74.8 kg)  08/24/19 150 lb (68 kg)    Physical Exam  Constitutional: Patient appears well-developed and well-nourished.  No distress.  HEENT: head atraumatic, normocephalic, pupils equal and reactive to light, neck supple Cardiovascular: Normal rate, regular rhythm and normal heart sounds.  No murmur heard. No BLE edema. Pulmonary/Chest: Effort normal and breath sounds normal. No respiratory distress. Abdominal: Soft.  There is no tenderness. NO CVA tenderness Musculoskeletal: No tenderness, slight swelling noted to right knee Psychiatric: Patient has a normal mood and affect. behavior is normal. Judgment and thought content normal.   Results for orders placed or performed in visit on 09/17/21  POCT urinalysis dipstick  Result Value Ref Range   Color, UA yellow    Clarity, UA clear    Glucose, UA Negative Negative  Bilirubin, UA negative    Ketones, UA negative    Spec Grav, UA 1.020 1.010 - 1.025   Blood, UA small    pH, UA 6.0 5.0 - 8.0   Protein, UA Positive (A) Negative   Urobilinogen, UA negative (A) 0.2 or 1.0 E.U./dL   Nitrite, UA negative    Leukocytes, UA Small (1+) (A) Negative   Appearance cloudy    Odor none       Assessment & Plan:   1. Frequency of urination  - POCT urinalysis dipstick - Urine Culture - Ambulatory referral to Urology - nitrofurantoin, macrocrystal-monohydrate, (MACROBID) 100 MG capsule; Take 1 capsule (100 mg total) by mouth 2 (two) times daily for 5 days.  Dispense: 10 capsule; Refill: 0  2. Psoriasis  - Ambulatory referral to Rheumatology  3. Bilateral hand pain  - Ambulatory  referral to Rheumatology  4. Chronic pain of both knees  - Ambulatory referral to Rheumatology  5. Chronic pain of both ankles  - Ambulatory referral to Rheumatology   Follow up plan: Return if symptoms worsen or fail to improve.

## 2021-09-18 LAB — URINE CULTURE
MICRO NUMBER:: 12918376
SPECIMEN QUALITY:: ADEQUATE

## 2021-09-19 ENCOUNTER — Ambulatory Visit: Payer: Self-pay | Admitting: *Deleted

## 2021-09-19 NOTE — Telephone Encounter (Signed)
Pt. States she still has discomfort after urination, "but it is much better." Given lab results as well. Please advise pt.

## 2021-09-19 NOTE — Telephone Encounter (Signed)
Answer Assessment - Initial Assessment Questions 1. SYMPTOM: "What's the main symptom you're concerned about?" (e.g., frequency, incontinence)     Discomfort after urination 2. ONSET: "When did the  symptoms  start?"     Tuesday 3. PAIN: "Is there any pain?" If Yes, ask: "How bad is it?" (Scale: 1-10; mild, moderate, severe)     No 4. CAUSE: "What do you think is causing the symptoms?"     Unsure 5. OTHER SYMPTOMS: "Do you have any other symptoms?" (e.g., fever, flank pain, blood in urine, pain with urination)     No. Had a UTI 3 WEEKS AGO 6. PREGNANCY: "Is there any chance you are pregnant?" "When was your last menstrual period?"     No  Protocols used: Urinary Symptoms-A-AH

## 2021-09-19 NOTE — Telephone Encounter (Signed)
Summary: UTI symptoms   Pt called stating that she missed a call from the office today  Best contact: [phone]   Pt says she still has symptoms of a UTI and wants to discuss relief options   ----- Message from Randol Kern sent at 09/19/2021  3:22 PM EST -----  Pt called stating that she missed a call from the office today  Best contact: [phone]   Pt says she still has symptoms of a UTI and wants to discuss relief options       Called [facility] to review UTI sx. No answer, LVMTCB [phone]

## 2021-09-22 NOTE — Telephone Encounter (Signed)
Patient notified

## 2021-09-22 NOTE — Telephone Encounter (Signed)
UTi is better some but urine is still dark after urination. No discharge or any other symptoms.Is taking cranberry pills, but no AZO or anything else

## 2021-10-30 NOTE — Progress Notes (Incomplete)
? ?10/30/21 ?1:26 PM  ? ?[place] ?1996/01/10 ?[id number] ? ?Referring provider:  ?Delsa Grana, PA-C ?[place] ?[address] ?[place],  [place] [address] ?No chief complaint on file. ? ? ? ?HPI: ?Emma Edwards is a 26 y.o.female who presents today for further evaluation of urinary frequency.  ? ?She was seen on 09/17/2021 by Serafina Royals, FNP, for UTI that she had on 08/26/2021. She was treated with Macrobid for this infection. Her symptoms returned and included dysuria, frequency, suprapubic pain. She was treated for presumed UTI with Macrobid. Urine culture grew mixed genital flora and she was advised to stop taking antibiotic.  ? ?She was seen in the ED on 10/23/2021 for further evaluation of painful urination's that was accompanied by frequency and burning. Urinalysis showed 3+ blood, nitrite negative, 3+ leukocytes, 95 RBCS, and >182 WBCs. She was discharged on Bactrim and Pyridium.  ? ? ? ?PMH: ?Past Medical History:  ?Diagnosis Date  ? Allergy   ? Anxiety   ? [id number]) 2013  ? Pilonidal cyst 2014  ? ? ?Surgical History: ?No past surgical history on file. ? ?Home Medications:  ?Allergies as of 10/31/2021   ? ?   Reactions  ? Amoxicillin Hives  ? Penicillins Hives  ? ?  ? ?  ?Medication List  ?  ? ?  ? Accurate as of October 30, 2021  1:26 PM. If you have any questions, ask your nurse or doctor.  ?  ?  ? ?  ? ?albuterol 108 (90 Base) MCG/ACT inhaler ?Commonly known as: VENTOLIN HFA ?Inhale into the lungs. ?  ?B12-ACTIVE PO ?Take by mouth. ?  ?diazepam 5 MG tablet ?Commonly known as: VALIUM ?Take 5 mg by mouth as needed. ?  ?Estarylla 0.25-35 MG-MCG tablet ?Generic drug: norgestimate-ethinyl estradiol ?Take 1 tablet by mouth daily. ?  ?hydrOXYzine 25 MG tablet ?Commonly known as: ATARAX ?Take 25 mg by mouth every 6 (six) hours as needed. ?  ?promethazine 25 MG tablet ?Commonly known as: PHENERGAN ?promethazine 25 mg tablet ? TAKE 1 TABLET BY MOUTH EVERY 4 TO 6 HOURS AS NEEDED FOR NAUSEA ?   ?VITAMIN C PO ?Take by mouth. ?  ?zolpidem 10 MG tablet ?Commonly known as: AMBIEN ?Take 10 mg by mouth at bedtime as needed. ?  ? ?  ? ? ?Allergies:  ?Allergies  ?Allergen Reactions  ? Amoxicillin Hives  ? Penicillins Hives  ? ? ?Family History: ?Family History  ?Problem Relation Age of Onset  ? Alcohol abuse Mother   ? Depression Mother   ? Drug abuse Mother   ? Mental illness Mother   ? Alcohol abuse Father   ? Arthritis Father   ? Depression Father   ? Drug abuse Father   ? Hypertension Father   ? Hypertension Paternal Uncle   ? Alcohol abuse Maternal Grandmother   ? Depression Maternal Grandmother   ? Drug abuse Maternal Grandmother   ? Mental illness Maternal Grandmother   ? Alcohol abuse Maternal Grandfather   ? Drug abuse Maternal Grandfather   ? Arthritis Paternal Grandmother   ? Asthma Paternal Grandmother   ? Cancer Paternal Grandmother   ? COPD Paternal Grandmother   ? Hyperlipidemia Paternal Grandmother   ? ? ?Social History:  reports that she quit smoking about 5 years ago. Her smoking use included cigarettes. She has a 2.00 pack-year smoking history. She has never used smokeless tobacco. She reports that she does not drink alcohol and does not use drugs. ? ? ?  Physical Exam: ?There were no vitals taken for this visit.  ?Constitutional:  Alert and oriented, No acute distress. ?HEENT: Vici AT, moist mucus membranes.  Trachea midline, no masses. ?Cardiovascular: No clubbing, cyanosis, or edema. ?Respiratory: Normal respiratory effort, no increased work of breathing. ?Skin: No rashes, bruises or suspicious lesions. ?Neurologic: Grossly intact, no focal deficits, moving all 4 extremities. ?Psychiatric: Normal mood and affect. ? ? ?Urinalysis ? ? ?Pertinent Imaging: ? ? ? ?Assessment & Plan:   ? ? ?No follow-ups on file. ? ?I,Kailey Littlejohn,acting as a scribe for Hollice Espy, MD.,have documented all relevant documentation on the behalf of Hollice Espy, MD,as directed by  Hollice Espy, MD while in  the presence of Hollice Espy, MD. ? ? ?[facility] ?[address], [address] ?[place], [place] [address] ?[phone] ? ?

## 2021-10-31 ENCOUNTER — Ambulatory Visit: Payer: Medicaid Other | Admitting: Urology

## 2021-10-31 ENCOUNTER — Other Ambulatory Visit: Payer: Self-pay | Admitting: *Deleted

## 2021-10-31 DIAGNOSIS — R350 Frequency of micturition: Secondary | ICD-10-CM

## 2021-12-05 ENCOUNTER — Telehealth: Payer: Self-pay

## 2021-12-05 NOTE — Telephone Encounter (Signed)
Lvm informing pt to return call to schedule appt.

## 2021-12-05 NOTE — Telephone Encounter (Signed)
Copied from CRM [id number]. Topic: General - Other >> Dec 05, 2021 10:28 AM Randol Kern wrote: Pt called back to report [facility]. Call disconnected, patient was reporting that she needs to refill her birth control medicine.

## 2021-12-05 NOTE — Telephone Encounter (Signed)
Pt needs an appointment for her birth control. Also while trying to get her schedule, can you please ask [facility] Thank you! ?

## 2022-05-26 ENCOUNTER — Ambulatory Visit: Payer: Self-pay | Admitting: *Deleted

## 2022-05-26 NOTE — Telephone Encounter (Signed)
.   Chief Complaint: lymph nodes in axilla Symptoms: lump on breast Frequency: lymph nodes a year, lump is new Pertinent Negatives: Patient denies fever Disposition: [] ED /[] Urgent Care (no appt availability in office) / [x] Appointment(In office/virtual)/ []  [facility]/ [] Home Care/ [] Refused Recommended Disposition /[] [facility]/ []  Follow-up with PCP Additional Notes: Appt made in office, pt to call back if change in condition.   Reason for Disposition  Breast lump  Answer Assessment - Initial Assessment Questions 1. SYMPTOM: "What's the main symptom you're concerned about?"  (e.g., lump, pain, rash, nipple discharge)     Left breast lump but both axilla have swollen lymph nodes 2. LOCATION: "Where is the "pimple" located?"     Side of right breast 3. ONSET: "When did bump lymph nodes  start?"     Lymph nodes a year, pimple 2 days now, 1/2" in size 4. PRIOR HISTORY: "Do you have any history of prior problems with your breasts?" (e.g., lumps, cancer, fibrocystic breast disease)     psoriasis 5. CAUSE: "What do you think is causing this symptom?"     Worried about breast cancer 6. OTHER SYMPTOMS: "Do you have any other symptoms?" (e.g., fever, breast pain, redness or rash, nipple discharge)     No, just where bump is. 7. PREGNANCY-BREASTFEEDING: "Is there any chance you are pregnant?" "When was your last menstrual period?" "Are you breastfeeding?"     no  Protocols used: Breast Symptoms-A-AH

## 2022-05-27 ENCOUNTER — Ambulatory Visit: Payer: Medicaid Other | Admitting: Nurse Practitioner

## 2022-06-01 ENCOUNTER — Ambulatory Visit: Payer: Medicaid Other | Admitting: Family Medicine

## 2022-06-03 ENCOUNTER — Ambulatory Visit
Admission: EM | Admit: 2022-06-03 | Discharge: 2022-06-03 | Disposition: A | Discharge status: Home / Self Care | Payer: Self-pay | Attending: Emergency Medicine | Admitting: Emergency Medicine

## 2022-06-03 DIAGNOSIS — N3090 Cystitis, unspecified without hematuria: Secondary | ICD-10-CM | POA: Insufficient documentation

## 2022-06-03 DIAGNOSIS — N390 Urinary tract infection, site not specified: Secondary | ICD-10-CM | POA: Insufficient documentation

## 2022-06-03 LAB — URINALYSIS, ROUTINE W REFLEX MICROSCOPIC
Glucose, UA: NEGATIVE mg/dL
Nitrite: NEGATIVE
Protein, ur: 100 mg/dL — AB
Specific Gravity, Urine: >1.03 — ABNORMAL HIGH (ref 1.005–1.030)
pH: 5.5 (ref 5.0–8.0)

## 2022-06-03 LAB — PREGNANCY, URINE: Preg Test, Ur: NEGATIVE

## 2022-06-03 LAB — URINALYSIS, MICROSCOPIC (REFLEX): WBC, UA: >50 WBC/hpf (ref 0–5)

## 2022-06-03 MED ORDER — NITROFURANTOIN MONOHYD MACRO 100 MG PO CAPS: 100.0000 mg | ORAL_CAPSULE | Freq: Two times a day (BID) | ORAL | 0 refills | Status: AC

## 2022-06-03 MED ORDER — PHENAZOPYRIDINE HCL 200 MG PO TABS: 200.0000 mg | ORAL_TABLET | Freq: Three times a day (TID) | ORAL | 0 refills | Status: DC | PRN

## 2022-06-03 NOTE — ED Triage Notes (Signed)
Patient reports that she has had frequent urination and pain with urination since this AM.   Patient denies any vaginal discharge.

## 2022-06-03 NOTE — Discharge Instructions (Signed)
-  Urinalysis is consistent with UTI.  We will send the urine for culture -Macrobid: 1 tablet twice a day for 7 days -Continue the over-the-counter Azo and ibuprofen/Tylenol as needed for pain -Increase fluid intake -We will contact you if the culture reports necessitates any change in treatment. -Follow-up with primary care as needed

## 2022-06-03 NOTE — ED Provider Notes (Signed)
[facility]    CSN: [id number] Arrival date & time: 06/03/22  1409      History   Chief Complaint Chief Complaint  Patient presents with   Dysuria   Urinary Frequency    HPI Emma Edwards is a 26 y.o. female.   Patient is a 26 year old female who presents with chief complaint of urinary symptoms that began this morning.  Patient reports recurrent UTIs, typically twice a year.  Patient reports burning and frequency along with bladder pain.  Patient states the pain is throughout urinary episode.  Patient ports taking ibuprofen and Azo this morning.    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 No pertinent surgical history.  OB History     Gravida  1   Para      Term      Preterm      AB  1   Living         SAB  1   IAB      Ectopic      Multiple      Live Births               Home Medications    Prior to Admission medications   Medication Sig Start Date End Date Taking? Authorizing Provider  nitrofurantoin, macrocrystal-monohydrate, (MACROBID) 100 MG capsule Take 1 capsule (100 mg total) by mouth 2 (two) times daily for 7 days. 06/03/22 06/10/22 Yes Candis Schatz, PA-C  albuterol (VENTOLIN HFA) 108 (90 Base) MCG/ACT inhaler Inhale into the lungs. 01/02/21   [provider]   Ascorbic Acid (VITAMIN C PO) Take by mouth. Patient not taking: Reported on 09/17/2021    [provider]  diazepam (VALIUM) 5 MG tablet Take 5 mg by mouth as needed. 03/11/21   [provider]  ESTARYLLA 0.25-35 MG-MCG tablet Take 1 tablet by mouth daily. 03/14/21   [provider]  hydrOXYzine (ATARAX/VISTARIL) 25 MG tablet Take 25 mg by mouth every 6 (six) hours as needed. 03/11/21   [provider]  Methylcobalamin (B12-ACTIVE PO) Take by mouth. Patient not taking: Reported on 09/17/2021    [provider]  promethazine (PHENERGAN) 25 MG tablet promethazine 25 mg tablet  TAKE 1 TABLET BY MOUTH EVERY 4 TO 6 HOURS AS NEEDED FOR NAUSEA Patient not taking: Reported on 09/17/2021    [provider]  zolpidem (AMBIEN) 10 MG tablet Take 10 mg by mouth at bedtime as needed. Patient not taking: Reported on 09/17/2021 02/28/21   [provider]    Family History Family History  Problem Relation Age of Onset   Alcohol abuse Mother    Depression Mother    Drug abuse Mother    Mental illness Mother    Alcohol abuse Father    Arthritis Father    Depression Father  Drug abuse Father    Hypertension Father    Hypertension Paternal Uncle    Alcohol abuse Maternal Grandmother    Depression Maternal Grandmother    Drug abuse Maternal Grandmother    Mental illness Maternal Grandmother    Alcohol abuse Maternal Grandfather    Drug abuse Maternal Grandfather    Arthritis Paternal Grandmother    Asthma Paternal Grandmother    Cancer Paternal Grandmother    COPD Paternal Grandmother    Hyperlipidemia Paternal Grandmother     Social History Social History   Tobacco Use   Smoking status: Former    Packs/day: 1.00    Years: 2.00    Total pack years: 2.00    Types: Cigarettes    Quit date: 02/22/2016    Years since quitting: 6.2   Smokeless tobacco: Never  Vaping Use   Vaping Use: Never used  Substance Use Topics   Alcohol use: No     Alcohol/week: 0.0 standard drinks of alcohol   Drug use: No     Allergies   Amoxicillin and Penicillins   Review of Systems Review of Systems as noted above in HPI.  Other systems reviewed and found to be negative   Physical Exam Triage Vital Signs ED Triage Vitals  Enc Vitals Group     BP 06/03/22 1444 113/79     Pulse Rate 06/03/22 1444 75     Resp --      Temp 06/03/22 1444 98.2 F (36.8 C)     Temp Source 06/03/22 1444 Oral     SpO2 06/03/22 1444 100 %     Weight 06/03/22 1442 165 lb (74.8 kg)     Height 06/03/22 1442 5\' 4"  (1.626 m)     Head Circumference --      Peak Flow --      Pain Score 06/03/22 1441 9     Pain Loc --      Pain Edu? --      Excl. in [place]? --    No data found.  Updated Vital Signs BP 113/79 (BP Location: Left Arm)   Pulse 75   Temp 98.2 F (36.8 C) (Oral)   Ht 5\' 4"  (1.626 m)   Wt 165 lb (74.8 kg)   LMP 05/21/2022 (Approximate)   SpO2 100%   BMI 28.32 kg/m   Visual Acuity Right Eye Distance:   Left Eye Distance:   Bilateral Distance:    Right Eye Near:   Left Eye Near:    Bilateral Near:     Physical Exam Constitutional:      Appearance: Normal appearance.  HENT:     Head: Normocephalic and atraumatic.  Cardiovascular:     Rate and Rhythm: Normal rate.  Pulmonary:     Effort: Pulmonary effort is normal.  Abdominal:     Palpations: Abdomen is soft.     Tenderness: There is abdominal tenderness (over bladder with palpation). There is no right CVA tenderness, left CVA tenderness or rebound.  Skin:    Capillary Refill: Capillary refill takes less than 2 seconds.  Neurological:     General: No focal deficit present.     Mental Status: She is alert and oriented to person, place, and time.      UC Treatments / Results  Labs (all labs ordered are listed, but only abnormal results are displayed) Labs Reviewed  URINALYSIS, ROUTINE W REFLEX MICROSCOPIC - Abnormal; Notable for the following components:      Result Value  APPearance CLOUDY (*)    Specific Gravity, Urine >1.030 (*)    Hgb urine dipstick LARGE (*)    Bilirubin Urine SMALL (*)    Ketones, ur TRACE (*)    Protein, ur 100 (*)    Leukocytes,Ua MODERATE (*)    All other components within normal limits  URINALYSIS, MICROSCOPIC (REFLEX) - Abnormal; Notable for the following components:   Bacteria, UA MANY (*)    All other components within normal limits  URINE CULTURE  PREGNANCY, URINE    EKG   Radiology No results found.  Procedures Procedures (including critical care time)  Medications Ordered in UC Medications - No data to display  Initial Impression / Assessment and Plan / UC Course  I have reviewed the triage vital signs and the nursing notes.  Pertinent labs & imaging results that were available during my care of the patient were reviewed by me and considered in my medical decision making (see chart for details).    Patient with reports of recurrent UTIs who woke up with burning sensation and frequency as well as bladder pain this morning.  Urinalysis consistent with UTI.  Sent for culture.  Prescription for Macrobid. Final Clinical Impressions(s) / UC Diagnoses   Final diagnoses:  Lower urinary tract infectious disease  Cystitis     Discharge Instructions      -Urinalysis is consistent with UTI.  We will send the urine for culture -Macrobid: 1 tablet twice a day for 7 days -Continue the over-the-counter Azo and ibuprofen/Tylenol as needed for pain -Increase fluid intake -We will contact you if the culture reports necessitates any change in treatment. -Follow-up with primary care as needed     ED Prescriptions     Medication Sig Dispense Auth. Provider   nitrofurantoin, macrocrystal-monohydrate, (MACROBID) 100 MG capsule Take 1 capsule (100 mg total) by mouth 2 (two) times daily for 7 days. 14 capsule Candis Schatz, PA-C      PDMP not reviewed this encounter.   Candis Schatz, PA-C 06/03/22  1514

## 2022-06-05 LAB — URINE CULTURE: Culture: <10000 — AB

## 2023-04-20 ENCOUNTER — Ambulatory Visit: Payer: Self-pay | Admitting: *Deleted

## 2023-04-20 NOTE — Telephone Encounter (Signed)
Unable to reach pt, number not in service. Will route to provider. FYI

## 2023-04-20 NOTE — Telephone Encounter (Signed)
Three attempts to reach pt, number not in service. No alternate number provided. Routing to practice for provider's resolution per protocol.

## 2023-04-20 NOTE — Telephone Encounter (Signed)
Message from [place] B sent at 04/20/2023 10:21 AM EDT  Summary: off and on swelling and pain   Pt called in says has welling off and on with legs and ankles, had previus pain, but not going on as of now, but has previously with  swelling also. The last time was yesterday         Called pt but automated message"the number you dialed is not in service."

## 2023-04-20 NOTE — Telephone Encounter (Signed)
Summary: off and on swelling and pain   Pt called in says has welling off and on with legs and ankles, had previus pain, but not going on as of now, but has previously with  swelling also. The last time was yesterday      Attempted to reach on number provided, "Number not in service at this time."  Re-dialed, same message.

## 2023-05-07 ENCOUNTER — Ambulatory Visit: Payer: Medicaid Other | Admitting: Family Medicine

## 2023-06-14 ENCOUNTER — Ambulatory Visit: Payer: Medicaid Other | Attending: Family Medicine

## 2023-06-14 ENCOUNTER — Encounter: Payer: Self-pay | Admitting: Family Medicine

## 2023-06-14 ENCOUNTER — Ambulatory Visit (INDEPENDENT_AMBULATORY_CARE_PROVIDER_SITE_OTHER): Payer: Medicaid Other | Admitting: Family Medicine

## 2023-06-14 VITALS — BP 114/72 | HR 96 | Temp 97.7°F | Resp 16 | Ht 64.0 in | Wt 149.5 lb

## 2023-06-14 DIAGNOSIS — F419 Anxiety disorder, unspecified: Secondary | ICD-10-CM

## 2023-06-14 DIAGNOSIS — Z09 Encounter for follow-up examination after completed treatment for conditions other than malignant neoplasm: Secondary | ICD-10-CM

## 2023-06-14 DIAGNOSIS — R55 Syncope and collapse: Secondary | ICD-10-CM

## 2023-06-14 DIAGNOSIS — R002 Palpitations: Secondary | ICD-10-CM

## 2023-06-14 DIAGNOSIS — F39 Unspecified mood [affective] disorder: Secondary | ICD-10-CM | POA: Diagnosis not present

## 2023-06-14 DIAGNOSIS — I951 Orthostatic hypotension: Secondary | ICD-10-CM | POA: Diagnosis not present

## 2023-06-14 NOTE — Progress Notes (Unsigned)
Patient ID: Emma Edwards, female    DOB: [birth year], 27 y.o.   MRN: [id number]  PCP: [name], PA-C  Chief Complaint  Patient presents with   Low BP   Dizziness    In the morning when pt bends over   Tendonitis    R arm    Subjective:   Emma Edwards is a 27 y.o. female, presents to clinic with CC of the following:  HPI   Low bp concerns and she has near syncope/dizziness, tunnel vision when she bends over and then stands up Friday she had an episode at [employer] where she could not recover and EMS was called and then she went to the ER BP noted to drop 30 points with positional changes when she was being   Palpitations - feels like forceful contractions but she has had normal rate on her BP monitor and when checking pulse manually     Patient Active Problem List   Diagnosis Date Noted   Screen for STD (sexually transmitted disease) 06/24/2016   Vaginal discharge 06/24/2016   Labial lesion 06/24/2016   Sebaceous cyst 09/09/2015   Influenza vaccination declined by patient 05/10/2015   Threatened abortion in first trimester 02/13/2015   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 (Patient not taking: Reported on 09/17/2021), Disp: , Rfl:    diazepam (VALIUM) 5 MG tablet, Take 5 mg by mouth as needed. (Patient not taking: Reported on 06/14/2023), Disp: , Rfl:    ESTARYLLA 0.25-35 MG-MCG tablet, Take 1 tablet by mouth daily. (Patient not taking: Reported on  06/14/2023), Disp: , Rfl:    Methylcobalamin (B12-ACTIVE PO), Take by mouth. (Patient not taking: Reported on 09/17/2021), Disp: , Rfl:    phenazopyridine (PYRIDIUM) 200 MG tablet, Take 1 tablet (200 mg total) by mouth 3 (three) times daily as needed for pain. (Patient not taking: Reported on 06/14/2023), Disp: 6 tablet, Rfl: 0   promethazine (PHENERGAN) 25 MG tablet, promethazine 25 mg tablet  TAKE 1 TABLET BY MOUTH EVERY 4 TO 6 HOURS AS NEEDED FOR NAUSEA (Patient not taking: Reported on 09/17/2021), Disp: , Rfl:    zolpidem (AMBIEN) 10 MG tablet, Take 10 mg by mouth at bedtime as needed. (Patient not taking: Reported on 09/17/2021), Disp: , Rfl:    Allergies  Allergen Reactions   Amoxicillin Hives   Penicillins Hives     Social History   Tobacco Use   Smoking status: Former    Current packs/day: 0.00    Average packs/day: 1 pack/day for 2.0 years (2.0 ttl pk-yrs)    Types: Cigarettes    Start date: 02/21/2014    Quit date: 02/22/2016    Years since quitting: 7.3   Smokeless tobacco: Never  Vaping Use   Vaping status: Never Used  Substance Use Topics   Alcohol use: No    Alcohol/week: 0.0 standard drinks of alcohol   Drug use: No      Chart Review Today: ***  Review of Systems     Objective:   Vitals:   06/14/23 1308  BP: 114/72  Pulse: 96  Resp: 16  Temp: 97.7 F (36.5 C)  TempSrc: Temporal  SpO2: 98%  Weight: 149 lb 8 oz (67.8 kg)  Height: 5\' 4"  (1.626 m)    Body mass index is 25.66 kg/m.  Physical Exam   Results for orders placed or performed during the hospital encounter of 06/03/22  Urine Culture   Specimen: Urine, Clean Catch  Result Value Ref Range   Specimen Description      URINE, CLEAN CATCH Performed at [facility], [address], [place], [place] [address]    Special Requests      NONE Performed at [facility], [address] [place], [place] [address]    Culture (A)     <10,000 COLONIES/mL INSIGNIFICANT  GROWTH Performed at [facility], [address], [place], [place] [address]    Report Status 06/05/2022 FINAL   Urinalysis, Routine w reflex microscopic Urine, Clean Catch  Result Value Ref Range   Color, Urine YELLOW YELLOW   APPearance CLOUDY (A) CLEAR   Specific Gravity, Urine >1.030 (H) 1.005 - 1.030   pH 5.5 5.0 - 8.0   Glucose, UA NEGATIVE NEGATIVE mg/dL   Hgb urine dipstick LARGE (A) NEGATIVE   Bilirubin Urine SMALL (A) NEGATIVE   Ketones, ur TRACE (A) NEGATIVE mg/dL   Protein, ur 132 (A) NEGATIVE mg/dL   Nitrite NEGATIVE NEGATIVE   Leukocytes,Ua MODERATE (A) NEGATIVE  Pregnancy, urine  Result Value Ref Range   Preg Test, Ur NEGATIVE NEGATIVE  Urinalysis, Microscopic (reflex)  Result Value Ref Range   RBC / HPF 6-10 0 - 5 RBC/hpf   WBC, UA >50 0 - 5 WBC/hpf   Bacteria, UA MANY (A) NONE SEEN   Squamous Epithelial / HPF 6-10 0 - 5       Assessment & Plan:       [name], PA-C 06/14/23 1:11 PM

## 2023-06-15 LAB — COMPLETE METABOLIC PANEL WITH GFR
AG Ratio: 1.7 (calc) (ref 1.0–2.5)
ALT: 9 U/L (ref 6–29)
AST: 10 U/L (ref 10–30)
Albumin: 4.3 g/dL (ref 3.6–5.1)
Alkaline phosphatase (APISO): 55 U/L (ref 31–125)
BUN: 14 mg/dL (ref 7–25)
CO2: 26 mmol/L (ref 20–32)
Calcium: 9.4 mg/dL (ref 8.6–10.2)
Chloride: 106 mmol/L (ref 98–110)
Creat: 0.67 mg/dL (ref 0.50–0.96)
Globulin: 2.6 g/dL (ref 1.9–3.7)
Glucose, Bld: 89 mg/dL (ref 65–99)
Potassium: 4.5 mmol/L (ref 3.5–5.3)
Sodium: 140 mmol/L (ref 135–146)
Total Bilirubin: 0.4 mg/dL (ref 0.2–1.2)
Total Protein: 6.9 g/dL (ref 6.1–8.1)
eGFR: 122 mL/min/{1.73_m2} (ref >=60)

## 2023-06-15 LAB — TSH: TSH: 1.57 m[IU]/L

## 2023-06-17 ENCOUNTER — Other Ambulatory Visit (HOSPITAL_COMMUNITY)
Admission: RE | Admit: 2023-06-17 | Discharge: 2023-06-17 | Disposition: A | Discharge status: Home / Self Care | Payer: Medicaid Other | Source: Ambulatory Visit | Attending: Family Medicine | Admitting: Family Medicine

## 2023-06-17 ENCOUNTER — Encounter: Payer: Self-pay | Admitting: Family Medicine

## 2023-06-17 ENCOUNTER — Ambulatory Visit (INDEPENDENT_AMBULATORY_CARE_PROVIDER_SITE_OTHER): Payer: Medicaid Other | Admitting: Family Medicine

## 2023-06-17 VITALS — BP 122/74 | HR 95 | Temp 97.9°F | Resp 20 | Ht 64.0 in | Wt 149.0 lb

## 2023-06-17 DIAGNOSIS — F419 Anxiety disorder, unspecified: Secondary | ICD-10-CM

## 2023-06-17 DIAGNOSIS — R002 Palpitations: Secondary | ICD-10-CM | POA: Diagnosis not present

## 2023-06-17 DIAGNOSIS — N760 Acute vaginitis: Secondary | ICD-10-CM | POA: Diagnosis present

## 2023-06-17 DIAGNOSIS — N920 Excessive and frequent menstruation with regular cycle: Secondary | ICD-10-CM

## 2023-06-17 DIAGNOSIS — F39 Unspecified mood [affective] disorder: Secondary | ICD-10-CM | POA: Insufficient documentation

## 2023-06-17 DIAGNOSIS — N9089 Other specified noninflammatory disorders of vulva and perineum: Secondary | ICD-10-CM

## 2023-06-17 HISTORY — DX: Unspecified mood (affective) disorder: F39

## 2023-06-17 MED ORDER — CLOTRIMAZOLE-BETAMETHASONE 1-0.05 % EX CREA: 1.0000 | TOPICAL_CREAM | Freq: Every day | CUTANEOUS | 0 refills | Status: DC

## 2023-06-17 MED ORDER — HYDROXYZINE HCL 25 MG PO TABS: 25.0000 mg | ORAL_TABLET | Freq: Three times a day (TID) | ORAL | 1 refills | Status: DC | PRN

## 2023-06-17 MED ORDER — PROPRANOLOL HCL 20 MG PO TABS: 20.0000 mg | ORAL_TABLET | Freq: Three times a day (TID) | ORAL | 1 refills | Status: DC | PRN

## 2023-06-17 NOTE — Progress Notes (Signed)
Patient ID: Emma Edwards, female    DOB: 04-14-1996, 27 y.o.   MRN: [id number]  PCP: [name], PA-C  Chief Complaint  Patient presents with   Rash    Rash/lesion on inner labia states it itches and is white   Panic Attack    Subjective:   Emma Edwards is a 27 y.o. female, presents to clinic with CC of the following:  HPI  Here with some mild vulvovaginal itching - mild, some raised areas of tissue on labia she wanted checked, no sores, pain, discharge, monogamous for 4+ years Hx of left labial lesion with biopsy done in 2018 by Dr. Algis Downs - consistent with vitiligo (OV and pathology from Jan 2018 reviewed - at that time it was a painless vulvular lesion)   Anxiety and panic attacks severe - she asks for refill on hydroxyzine, she states she has limited $ to f/up with her psychiatrist right now HR at home when resting has been 80-110, gets faster and she is feeling anxious often with palpitations and skipped beats.  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 (Patient not taking: Reported on 09/17/2021), Disp: , Rfl:    diazepam (VALIUM) 5 MG tablet, Take 5 mg by mouth as needed. (Patient not taking: Reported on 06/14/2023), Disp:  , Rfl:    ESTARYLLA 0.25-35 MG-MCG tablet, Take 1 tablet by mouth daily. (Patient not taking: Reported on 06/14/2023), Disp: , Rfl:    hydrOXYzine (ATARAX/VISTARIL) 25 MG tablet, Take 25 mg by mouth every 6 (six) hours as needed. (Patient not taking: Reported on 06/17/2023), Disp: , Rfl:    Methylcobalamin (B12-ACTIVE PO), Take by mouth. (Patient not taking: Reported on 09/17/2021), Disp: , Rfl:    phenazopyridine (PYRIDIUM) 200 MG tablet, Take 1 tablet (200 mg total) by mouth 3 (three) times daily as needed for pain. (Patient not taking: Reported on 06/14/2023), Disp: 6 tablet, Rfl: 0   promethazine (PHENERGAN) 25 MG tablet, promethazine 25 mg tablet  TAKE 1 TABLET BY MOUTH EVERY 4 TO 6 HOURS AS NEEDED FOR NAUSEA (Patient not taking: Reported on 09/17/2021), Disp: , Rfl:    zolpidem (AMBIEN) 10 MG tablet, Take 10 mg by mouth at bedtime as needed. (Patient not taking: Reported on 09/17/2021), Disp: , Rfl:    Allergies  Allergen Reactions   Amoxicillin Hives   Penicillins Hives     Social History   Tobacco Use   Smoking status: Former    Current packs/day: 0.00    Average packs/day: 1 pack/day for 2.0 years (2.0 ttl pk-yrs)    Types: Cigarettes    Start date: 02/21/2014    Quit date: 02/22/2016    Years since quitting: 7.3   Smokeless tobacco: Never  Vaping Use   Vaping status: Never Used  Substance Use Topics   Alcohol use: No    Alcohol/week: 0.0 standard drinks of alcohol   Drug use: No      Chart Review Today: I personally reviewed active problem list, medication list, allergies, family history, social history, health maintenance, notes from last encounter, lab results, imaging with the patient/caregiver today.   Review of Systems  Constitutional: Negative.   HENT: Negative.    Eyes: Negative.   Respiratory: Negative.    Cardiovascular: Negative.   Gastrointestinal: Negative.   Endocrine: Negative.   Genitourinary: Negative.   Musculoskeletal: Negative.   Skin: Negative.    Allergic/Immunologic: Negative.   Neurological: Negative.   Hematological: Negative.   Psychiatric/Behavioral: Negative.    All other systems reviewed and are negative.      Objective:   Vitals:   06/17/23 1124  BP: 122/74  Pulse: 95  Resp: 20  Temp: 97.9 F (36.6 C)  SpO2: 100%  Weight: 149 lb (67.6 kg)  Height: 5\' 4"  (1.626 m)    Body mass index is 25.58 kg/m.  Physical Exam Vitals and nursing note reviewed.  Constitutional:      Appearance: She is well-developed.  HENT:     Head: Normocephalic and atraumatic.     Nose: Nose normal.  Eyes:     General:        Right eye: No discharge.        Left eye: No discharge.     Conjunctiva/sclera: Conjunctivae normal.  Neck:     Trachea: No tracheal deviation.  Cardiovascular:     Rate and Rhythm: Normal rate and regular rhythm.     Pulses: Normal pulses.     Heart sounds: Normal heart sounds. No murmur heard.    No friction rub. No gallop.  Pulmonary:     Effort: Pulmonary effort is normal. No respiratory distress.     Breath sounds: No stridor.  Genitourinary:    Comments: Bilateral internal labia with areas of slightly raised tissue No redness, lesions, swelling No discharge Musculoskeletal:        General: Normal range of motion.  Skin:    General: Skin is warm and dry.     Findings: No rash.  Neurological:     Mental Status: She is alert.     Motor: No abnormal muscle tone.     Coordination: Coordination normal.  Psychiatric:        Behavior: Behavior normal.      Results for orders placed or performed in visit on 06/14/23  COMPLETE METABOLIC PANEL WITH GFR  Result Value Ref Range   Glucose, Bld 89 65 - 99 mg/dL   BUN 14 7 - 25 mg/dL   Creat 1.61 0.96 - 0.45 mg/dL   eGFR 409 > OR = 60 WJ/XBJ/4.78G9   BUN/Creatinine Ratio SEE NOTE: 6 - 22 (calc)   Sodium 140 135 - 146 mmol/L   Potassium 4.5 3.5 - 5.3 mmol/L   Chloride 106 98 - 110 mmol/L   CO2 26 20 - 32 mmol/L   Calcium 9.4 8.6 - 10.2 mg/dL    Total Protein 6.9 6.1 - 8.1 g/dL   Albumin 4.3 3.6 - 5.1 g/dL   Globulin 2.6 1.9 - 3.7 g/dL (calc)   AG Ratio 1.7 1.0 - 2.5 (calc)   Total Bilirubin 0.4 0.2 - 1.2 mg/dL   Alkaline phosphatase (APISO) 55 31 - 125 U/L   AST 10 10 - 30 U/L  ALT 9 6 - 29 U/L  TSH  Result Value Ref Range   TSH 1.57 mIU/L       Assessment & Plan:   1. Anxiety More frequent and severe episodes of feeling anxious and panic (she wonders if she's having panic attacks) Does have psychiatrist Not on daily medications She asks for refill on her rescue med- - hydrOXYzine (ATARAX) 25 MG tablet; Take 1 tablet (25 mg total) by mouth every 8 (eight) hours as needed for anxiety.  Dispense: 30 tablet; Refill: 1  2. Acute vaginitis Mild itching intermittently She is going to try otc vaginal yeast infection creams (monistat), if that doesn't  help consider antifungal plus steroid Swab done today - clotrimazole-betamethasone (LOTRISONE) cream; Apply 1 Application topically daily.  Dispense: 30 g; Refill: 0 - Cervicovaginal ancillary only  3. Palpitations More episodes - HR 80-110 she states she is monitoring at home, will let her try propanolol prn for palpitations/racing heart Reviewed BB SE Holter monitor pending Tsh was normal    [name], PA-C 06/17/23 11:28 AM

## 2023-06-21 LAB — CERVICOVAGINAL ANCILLARY ONLY
Bacterial Vaginitis (gardnerella): NEGATIVE
Candida Glabrata: NEGATIVE
Candida Vaginitis: NEGATIVE
Comment: NEGATIVE
Comment: NEGATIVE
Comment: NEGATIVE

## 2023-06-28 ENCOUNTER — Telehealth: Payer: Medicaid Other | Admitting: Family Medicine

## 2023-06-28 NOTE — Progress Notes (Unsigned)
Virtual Visit via Video Note  I connected with Emma Edwards on 07/01/23 at  9:20 AM EST by a video enabled telemedicine application and verified that I am speaking with the correct person using two identifiers.  Today's Provider: Jacquelin Hawking, MHS, PA-C Introduced myself to the patient as a PA-C and provided education on APPs in clinical practice.   Location: Patient: at home  Provider: [facility], [place], [place]    I discussed the limitations of evaluation and management by telemedicine and the availability of in person appointments. The patient expressed understanding and agreed to proceed.  Chief Complaint  Patient presents with   Anxiety    Panic attacks    History of Present Illness:  Was most recently seen 06/17/23 with PCP for anxiety - provided with Hydroxyzine refill    She was prescribed Propanolol and Hydroxyzine  She was seen by [facility] and was started on Zoloft - she took 2 doses and then experienced 3 days of lack of sleep - she reports having panic attacks when she woke up  She has stopped taking the Zoloft now She does not plan on follow up with psychiatry services - she would like to have therapy services - she is going to reach out to her old therapist to see if she can connect there She has been on another SSRI previously but reports bad side effects from this as well   She would like therapy services predominantly and would like PRN anxiolytic rather than daily maintenance medication Up until last week she was having panic attack every 2 days but was able to sleep about 5 hours at night When she saw behavioral she started having multiple panic attacks and was unable to sleep at all She reports increased stress with living situation - thinks this was contributing to her panic attacks- she has moved in with her parents now and feels a lot better (she reports this will be a solution)   She was seen in the ED  yesterday and provided with Trazodone 50 mg PO at bedtime (15 tablets)   She reports she has had a cold since Wed of last week and is having productive cough She reports trying Mucinex but this exacerbated her panic attacks  She denies SOB or wheezing but reports increased coughing with deep inspiration  She reports having fever last week but this has stopped      06/29/2023    9:13 AM 06/14/2023    1:07 PM 09/12/2018    4:20 PM  GAD 7 : Generalized Anxiety Score  Nervous, Anxious, on Edge 3 3 2   Control/stop worrying 3 3 0  Worry too much - different things 3 3 2   Trouble relaxing 3 3 2   Restless 3 3 0  Easily annoyed or irritable 3 3 2   Afraid - awful might happen 3 3 0  Total GAD 7 Score 21 21 8   Anxiety Difficulty Very difficult Very difficult Somewhat difficult      06/29/2023    9:13 AM 06/17/2023   11:25 AM 06/14/2023    1:02 PM 05/15/2020    7:40 AM 08/24/2019   10:02 AM  Depression screen PHQ 2/9  Decreased Interest 1 1 2  0 3  Down, Depressed, Hopeless 1 2 1  0 2  PHQ - 2 Score 2 3 3  0 5  Altered sleeping 1 2 0  1  Tired, decreased energy 1 3 0  3  Change in appetite 1  1 0  1  Feeling bad or failure about yourself  0 1 0  1  Trouble concentrating 1 3 0  1  Moving slowly or fidgety/restless 0 0 0  0  Suicidal thoughts 0 0 0  1  PHQ-9 Score 6 13 3  13   Difficult doing work/chores Very difficult Very difficult Somewhat difficult  Somewhat difficult        Review of Systems  Constitutional:  Negative for chills and fever.  HENT:  Positive for congestion and sore throat.   Respiratory:  Positive for cough and sputum production. Negative for shortness of breath.   Cardiovascular:  Negative for chest pain and palpitations.  Musculoskeletal:  Positive for myalgias.  Psychiatric/Behavioral:  The patient is nervous/anxious.     Observations/Objective:  Due to the nature of the virtual visit, physical exam and observations are limited. Able to obtain the  following observations:  Alert, oriented, x3 Appears comfortable, in no acute distress.  No scleral injection, no appreciated hoarseness, tachypnea, wheeze or strider. Able to maintain conversation without visible strain.  cough appreciated during visit.   Assessment and Plan:  Problem List Items Addressed This Visit       Other   Anxiety - Primary    Chronic, ongoing, currently exacerbated Patient reports ongoing anxiety but was not well-managed with previous regimen of propranolol and hydroxyzine She reports being started on Zoloft by Eber Jones out reach but this caused extreme insomnia.  She was then seen in the emergency room and given trazodone to help her sleep She reports that her symptoms are improving with environmental changes (she is recently moved back in with her parents) She is hesitant to start a maintenance medication due to side effects from previous trials She thinks that she would benefit more from therapy services and is going to reach out to her previous therapist to determine availability If needed will provide referral to other therapy provider Recommend that she continues trazodone as needed to assist with sleep Will provide limited supply of alprazolam 0.25 mg p.o. twice daily as needed Reviewed that if she is needing Xanax more frequently and needing to take daily she would likely need a maintenance medication and further follow-up.  She expressed understanding and agreement with this Discussed not using xanax for sleep aid and to be mindful of sedation  Reviewed that if further course is needed she would need office apt for controlled substance agreement - voiced agreement and understanding Recommend follow-up in about 4 weeks to assess response and make sure that she is established with therapy services      Relevant Medications   traZODone (DESYREL) 50 MG tablet   ALPRAZolam (XANAX) 0.25 MG tablet   Other Visit Diagnoses     Upper respiratory tract  infection, unspecified type       Relevant Medications   doxycycline (VIBRA-TABS) 100 MG tablet      Acute, new concern Patient reports ongoing sinus congestion, rhinorrhea, headaches, fatigue, productive cough since Wednesday of last week that is not improving with home measures Given chronicity and current symptoms we will treat for potential bacterial sinusitis with doxycycline 100 mg p.o. twice daily x 7 days Reviewed that if symptoms are not improving with this regimen she should be seen in the office to evaluate for potential pneumonia Follow-up as needed for progressing or persistent symptoms  Return in about 4 weeks (around 07/27/2023) for anxiety.   I, Kasson Lamere E Trentin Knappenberger, PA-C, have reviewed all documentation for this visit.  The documentation on 07/01/23 for the exam, diagnosis, procedures, and orders are all accurate and complete.   Jacquelin Hawking, MHS, PA-C [facility]    Follow Up Instructions:    I discussed the assessment and treatment plan with the patient. The patient was provided an opportunity to ask questions and all were answered. The patient agreed with the plan and demonstrated an understanding of the instructions.   The patient was advised to call back or seek an in-person evaluation if the symptoms worsen or if the condition fails to improve as anticipated.  I provided 19 minutes of non-face-to-face time during this encounter.

## 2023-06-29 ENCOUNTER — Other Ambulatory Visit: Payer: Self-pay

## 2023-06-29 ENCOUNTER — Telehealth (INDEPENDENT_AMBULATORY_CARE_PROVIDER_SITE_OTHER): Payer: Medicaid Other | Admitting: Physician Assistant

## 2023-06-29 ENCOUNTER — Telehealth: Payer: Self-pay | Admitting: Family Medicine

## 2023-06-29 DIAGNOSIS — J069 Acute upper respiratory infection, unspecified: Secondary | ICD-10-CM | POA: Diagnosis not present

## 2023-06-29 DIAGNOSIS — N920 Excessive and frequent menstruation with regular cycle: Secondary | ICD-10-CM

## 2023-06-29 DIAGNOSIS — F419 Anxiety disorder, unspecified: Secondary | ICD-10-CM | POA: Diagnosis not present

## 2023-06-29 DIAGNOSIS — N760 Acute vaginitis: Secondary | ICD-10-CM

## 2023-06-29 DIAGNOSIS — N9089 Other specified noninflammatory disorders of vulva and perineum: Secondary | ICD-10-CM

## 2023-06-29 MED ORDER — DOXYCYCLINE HYCLATE 100 MG PO TABS: 100.0000 mg | ORAL_TABLET | Freq: Two times a day (BID) | ORAL | 0 refills | Status: AC

## 2023-06-29 MED ORDER — ALPRAZOLAM 0.25 MG PO TABS: 0.2500 mg | ORAL_TABLET | Freq: Two times a day (BID) | ORAL | 0 refills | Status: DC | PRN

## 2023-06-29 NOTE — Telephone Encounter (Signed)
New referral placed.

## 2023-06-29 NOTE — Telephone Encounter (Signed)
New referral needed.

## 2023-06-29 NOTE — Telephone Encounter (Addendum)
Patient would like the OBGYN referral sent to a different location. Patient would like it sent to: [facility] on [address] in [place] & their fax number is [phone].  Patient's callback # [phone]

## 2023-07-01 NOTE — Assessment & Plan Note (Signed)
Chronic, ongoing, currently exacerbated Patient reports ongoing anxiety but was not well-managed with previous regimen of propranolol and hydroxyzine She reports being started on Zoloft by Eber Jones out reach but this caused extreme insomnia.  She was then seen in the emergency room and given trazodone to help her sleep She reports that her symptoms are improving with environmental changes (she is recently moved back in with her parents) She is hesitant to start a maintenance medication due to side effects from previous trials She thinks that she would benefit more from therapy services and is going to reach out to her previous therapist to determine availability If needed will provide referral to other therapy provider Recommend that she continues trazodone as needed to assist with sleep Will provide limited supply of alprazolam 0.25 mg p.o. twice daily as needed Reviewed that if she is needing Xanax more frequently and needing to take daily she would likely need a maintenance medication and further follow-up.  She expressed understanding and agreement with this Discussed not using xanax for sleep aid and to be mindful of sedation  Reviewed that if further course is needed she would need office apt for controlled substance agreement - voiced agreement and understanding Recommend follow-up in about 4 weeks to assess response and make sure that she is established with therapy services

## 2023-07-05 NOTE — Progress Notes (Deleted)
[name], PA-C   No chief complaint on file.  ???PAP???  HPI:      Ms. Emma Edwards is a 27 y.o. 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  Family History  Problem Relation Age of Onset   Alcohol abuse Mother    Depression Mother    Drug abuse Mother    Mental illness Mother    Alcohol abuse Father    Arthritis Father    Depression Father    Drug abuse Father    Hypertension Father    Hypertension Paternal Uncle    Alcohol abuse Maternal Grandmother    Depression Maternal Grandmother    Drug abuse Maternal Grandmother    Mental illness Maternal Grandmother    Alcohol abuse Maternal Grandfather    Drug abuse Maternal Grandfather    Arthritis Paternal Grandmother    Asthma Paternal Grandmother    Cancer Paternal Grandmother    COPD Paternal Grandmother    Hyperlipidemia Paternal Grandmother     Social History   Socioeconomic History   Marital status: Single    Spouse name: Not on file   Number of children: 0   Years of education: Not on file   Highest education level: High school graduate  Occupational History   Not on file  Tobacco Use   Smoking status: Former    Current  packs/day: 0.00    Average packs/day: 1 pack/day for 2.0 years (2.0 ttl pk-yrs)    Types: Cigarettes    Start date: 02/21/2014    Quit date: 02/22/2016    Years since quitting: 7.3   Smokeless tobacco: Never  Vaping Use   Vaping status: Never Used  Substance and Sexual Activity   Alcohol use: No    Alcohol/week: 0.0 standard drinks of alcohol   Drug use: No   Sexual activity: Yes    Partners: Male    Birth control/protection: Coitus interruptus  Other Topics Concern   Not on file  Social History Narrative   Not on file   Social Determinants of Health   Financial Resource Strain: Medium Risk (09/12/2018)   Overall Financial Resource Strain (CARDIA)    Difficulty of Paying Living Expenses: Somewhat hard  Food Insecurity: No Food Insecurity (09/12/2018)   Hunger Vital Sign    Worried About Running Out of Food in the Last Year: Never true    Ran Out of Food in the Last Year: Never true  Transportation Needs: No Transportation Needs (09/12/2018)   PRAPARE - Administrator, Civil Service (Medical): No    Lack of Transportation (Non-Medical): No  Physical Activity: Insufficiently Active (09/12/2018)   Exercise Vital Sign    Days of Exercise per Week: 2  days    Minutes of Exercise per Session: 60 min  Stress: Stress Concern Present (09/12/2018)   [employer] of Occupational Health - Occupational Stress Questionnaire    Feeling of Stress : To some extent  Social Connections: Socially Isolated (09/12/2018)   Social Connection and Isolation Panel [NHANES]    Frequency of Communication with Friends and Family: Once a week    Frequency of Social Gatherings with Friends and Family: Once a week    Attends Religious Services: Never    Database administrator or Organizations: No    Attends Banker Meetings: Never    Marital Status: Never married  Intimate Partner Violence: Not At Risk (09/12/2018)   Humiliation, Afraid, Rape, and Kick questionnaire    Fear of  Current or Ex-Partner: No    Emotionally Abused: No    Physically Abused: No    Sexually Abused: No    Outpatient Medications Prior to Visit  Medication Sig Dispense Refill   albuterol (VENTOLIN HFA) 108 (90 Base) MCG/ACT inhaler Inhale into the lungs.     ALPRAZolam (XANAX) 0.25 MG tablet Take 1 tablet (0.25 mg total) by mouth 2 (two) times daily as needed for anxiety. 20 tablet 0   Ashwagandha 300 MG TABS Take 350 mg by mouth daily as needed.     clotrimazole-betamethasone (LOTRISONE) cream Apply 1 Application topically daily. 30 g 0   doxycycline (VIBRA-TABS) 100 MG tablet Take 1 tablet (100 mg total) by mouth 2 (two) times daily for 7 days. 14 tablet 0   ESTARYLLA 0.25-35 MG-MCG tablet Take 1 tablet by mouth daily. (Patient not taking: Reported on 06/14/2023)     hydrOXYzine (ATARAX) 25 MG tablet Take 1 tablet (25 mg total) by mouth every 8 (eight) hours as needed for anxiety. 30 tablet 1   Lido-Menthol-Methyl Sal-Camph (CBD KINGS EX) Apply topically.     Melatonin 2.5 MG CHEW Chew 1 tablet by mouth at bedtime. PRN     propranolol (INDERAL) 20 MG tablet Take 1 tablet (20 mg total) by mouth 3 (three) times daily as needed (palpitations/racing heart/panic attacks). 30 tablet 1   traZODone (DESYREL) 50 MG tablet Take 1 tablet by mouth at bedtime.     No facility-administered medications prior to visit.      ROS:  Review of Systems BREAST: No symptoms   OBJECTIVE:   Vitals:  There were no vitals taken for this visit.  Physical Exam  Results: No results found for this or any previous visit (from the past 24 hour(s)).   Assessment/Plan: No diagnosis found.    No orders of the defined types were placed in this encounter.     No follow-ups on file.  Aiva Miskell B. Velera Lansdale, PA-C 07/05/2023 6:53 PM

## 2023-07-06 ENCOUNTER — Encounter: Payer: Medicaid Other | Admitting: Obstetrics and Gynecology

## 2023-07-12 ENCOUNTER — Ambulatory Visit: Payer: Self-pay | Admitting: *Deleted

## 2023-07-12 ENCOUNTER — Telehealth: Payer: Medicaid Other | Admitting: Physician Assistant

## 2023-07-12 NOTE — Telephone Encounter (Signed)
She will need apt to discuss this further-preferably in office as she will likely need controlled substance agreement to continue with xanax scripts.  We reviewed during her apt that if she is needing xanax daily we will need to start maintenance medication to assist with controlling her symptoms. We will also need to address her insomnia concerns as this is likely contributing to her symptoms.

## 2023-07-12 NOTE — Telephone Encounter (Signed)
  Chief Complaint: Anxiety Symptoms: More frequent panic attacks, increased anxiety. "Went out in public yesterday and had to take a Xanax." Insomnia. Frequency: 1 1/2 weeks Pertinent Negatives: Patient denies Suicidal, homicidal ideation. Disposition: [] ED /[] Urgent Care (no appt availability in office) / [] Appointment(In office/virtual)/ []  [facility]/ [] Home Care/ [] Refused Recommended Disposition /[] [facility]/ [x]  Follow-up with PCP Additional Notes:  Pt initially calling for refill of Xanax  States has 2 and 1/2 left. Had appt today, "I haven't been sleeping and fell asleep and slept thought the appt time." States has reached out to her previous therapist and is waiting to hear back.  Care advise provided. Pt states "I have been dealing with this for 17 years and have good support, I'll reach out then if needed."  Please advise on refill Xanax.  LRF 06/29/23  #20  0 refills. Reason for Disposition  Patient sounds very upset or troubled to the triager  Answer Assessment - Initial Assessment Questions 1. CONCERN: "Did anything happen that prompted you to call today?"      Nothing except went out in public yesterday. Had to take Xanax 2. ANXIETY SYMPTOMS: "Can you describe how you (your loved one; patient) have been feeling?" (e.g., tense, restless, panicky, anxious, keyed up, overwhelmed, sense of impending doom).      Panicky, "Can't regulate moods."  3. ONSET: "How long have you been feeling this way?" (e.g., hours, days, weeks)     1 -1/2 weeks 4. SEVERITY: "How would you rate the level of anxiety?" (e.g., 0 - 10; or mild, moderate, severe).     Varies, severe at times 5. FUNCTIONAL IMPAIRMENT: "How have these feelings affected your ability to do daily activities?" "Have you had more difficulty than usual doing your normal daily activities?" (e.g., getting better, same, worse; self-care, school, work, interactions)     Work, not as independent. 6. HISTORY: "Have  you felt this way before?" "Have you ever been diagnosed with an anxiety problem in the past?" (e.g., generalized anxiety disorder, panic attacks, PTSD). If Yes, ask: "How was this problem treated?" (e.g., medicines, counseling, etc.)     Yes "Personality disorder." 7. RISK OF HARM - SUICIDAL IDEATION: "Do you ever have thoughts of hurting or killing yourself?" If Yes, ask:  "Do you have these feelings now?" "Do you have a plan on how you would do this?"     no 8. TREATMENT:  "What has been done so far to treat this anxiety?" (e.g., medicines, relaxation strategies). "What has helped?"     Xanax 9. TREATMENT - THERAPIST: "Do you have a counselor or therapist? Name?"     Waiting to hear back 10. POTENTIAL TRIGGERS: "Do you drink caffeinated beverages (e.g., coffee, colas, teas), and how much daily?" "Do you drink alcohol or use any drugs?" "Have you started any new medicines recently?"       No 11. PATIENT SUPPORT: "Who is with you now?" "Who do you live with?" "Do you have family or friends who you can talk to?"        Therapist, Waiting to hear back. 12. OTHER SYMPTOMS: "Do you have any other symptoms?" (e.g., feeling depressed, trouble concentrating, trouble sleeping, trouble breathing, palpitations or fast heartbeat, chest pain, sweating, nausea, or diarrhea)      Insomnia  Protocols used: Anxiety and Panic Attack-A-AH

## 2023-07-12 NOTE — Telephone Encounter (Signed)
Pt needs in office appt  

## 2023-07-13 NOTE — Telephone Encounter (Signed)
Pt has appt with Denny Peon on tomorrow

## 2023-07-13 NOTE — Telephone Encounter (Signed)
Call cannot be completed at this time. I will try again later.

## 2023-07-14 ENCOUNTER — Encounter: Payer: Self-pay | Admitting: Physician Assistant

## 2023-07-14 ENCOUNTER — Ambulatory Visit (INDEPENDENT_AMBULATORY_CARE_PROVIDER_SITE_OTHER): Payer: Medicaid Other | Admitting: Physician Assistant

## 2023-07-14 VITALS — BP 114/72 | HR 92 | Temp 98.5°F | Resp 14 | Ht 64.0 in | Wt 145.5 lb

## 2023-07-14 DIAGNOSIS — F331 Major depressive disorder, recurrent, moderate: Secondary | ICD-10-CM | POA: Diagnosis not present

## 2023-07-14 DIAGNOSIS — Z79899 Other long term (current) drug therapy: Secondary | ICD-10-CM

## 2023-07-14 DIAGNOSIS — F5105 Insomnia due to other mental disorder: Secondary | ICD-10-CM | POA: Diagnosis not present

## 2023-07-14 DIAGNOSIS — F99 Mental disorder, not otherwise specified: Secondary | ICD-10-CM

## 2023-07-14 DIAGNOSIS — F419 Anxiety disorder, unspecified: Secondary | ICD-10-CM | POA: Diagnosis not present

## 2023-07-14 MED ORDER — PRAZOSIN HCL 1 MG PO CAPS: 1.0000 mg | ORAL_CAPSULE | Freq: Every day | ORAL | 0 refills | Status: DC

## 2023-07-14 MED ORDER — ALPRAZOLAM 0.25 MG PO TABS: 0.2500 mg | ORAL_TABLET | Freq: Two times a day (BID) | ORAL | 0 refills | Status: DC | PRN

## 2023-07-14 NOTE — Assessment & Plan Note (Signed)
Controlled substance agreement signed for Xanax 0.25 mg PO BID PRN - may take half -whole tablet as needed for anxiety and panic attack See scanned document for further information

## 2023-07-14 NOTE — Assessment & Plan Note (Addendum)
Chronic, ongoing  Appears mildly improved since previous visit - she still reports panic and agoraphobia today  GAD7 is 16 today, improved from 21 at last apt She has apt with therapist and psychiatry at the beginning of Dec  Will continue with Xanax 0.25 mg PO BID PRN for now- may take half to whole tablet as needed for anxiety and panic Reviewed that this would be better augmented with maintenance medication- will wait and defer to Psychiatry recommendations given her previous side effects with SSRI and hesitancy to start daily med at last apt Follow-up in 4 weeks or sooner if concerns arise

## 2023-07-14 NOTE — Progress Notes (Signed)
Established Patient Office Visit  Name: Emma Edwards   MRN: [id number]    DOB: 05-11-1996   Date:07/18/2023  Today's Provider: Jacquelin Hawking, MHS, PA-C Introduced myself to the patient as a PA-C and provided education on APPs in clinical practice.         Subjective  Chief Complaint  Chief Complaint  Patient presents with   Medication Refill    HPI  Depression/Anxiety She reports she feels mildly improved with her anxiety- thinks her panic attacks have decreased in frequency but she reports issues with agoraphobia She reports she is having a lot of issues with leaving home alone due to anxiety She is has only left home once since previous visit and was only able to stay out for about an hour  She has not been taking Trazodone or Melatonin- reports grogginess and bad nightmares She is taking Magnesium which helps with sleep initiation but she is waking up every 2 hours or so She is getting about 4-6 hours per night right now She reports nightmares are not every night but she does have vivid dreams  She has started a bedtime routine to assist with sleep- is taking a bath, reading prior to bed  She reports she has had to reduce her work hours- she has been discussing this with supervisor and they have a plan to accommodate current concerns   She has therapy apt scheduled for Dec 3 and psychiatry apt scheduled for Dec 2  She reports she has stopped recreational marijuana use about a month and half ago - reports she was habitually using for about 2 years prior to stopping  She reports a previous hx of self harm - she states she does not have plans or desire for self harm but when experiencing severe emotions she will hit or scratch herself She reports instances of self harm usually happen during emotional outbursts but she does not plan them and does not wish to end her life       07/14/2023    9:38 AM 06/29/2023    9:13 AM 06/17/2023   11:25 AM 06/14/2023    1:02  PM 05/15/2020    7:40 AM  Depression screen PHQ 2/9  Decreased Interest 2 1 1 2  0  Down, Depressed, Hopeless 2 1 2 1  0  PHQ - 2 Score 4 2 3 3  0  Altered sleeping 3 1 2  0   Tired, decreased energy 2 1 3  0   Change in appetite 0 1 1 0   Feeling bad or failure about yourself  1 0 1 0   Trouble concentrating 0 1 3 0   Moving slowly or fidgety/restless 0 0 0 0   Suicidal thoughts 1 0 0 0   PHQ-9 Score 11 6 13 3    Difficult doing work/chores Very difficult Very difficult Very difficult Somewhat difficult       07/14/2023    9:38 AM 06/29/2023    9:13 AM 06/14/2023    1:07 PM 09/12/2018    4:20 PM  GAD 7 : Generalized Anxiety Score  Nervous, Anxious, on Edge 3 3 3 2   Control/stop worrying 1 3 3  0  Worry too much - different things 3 3 3 2   Trouble relaxing 2 3 3 2   Restless 1 3 3  0  Easily annoyed or irritable 3 3 3 2   Afraid - awful might happen 3 3 3  0  Total GAD 7 Score  16 21 21 8   Anxiety Difficulty Very difficult Very difficult Very difficult Somewhat difficult         Patient Active Problem List   Diagnosis Date Noted   Moderate episode of recurrent major depressive disorder (HCC) 07/14/2023   Insomnia due to other mental disorder 07/14/2023   Controlled substance agreement signed 07/14/2023   Anxiety 06/29/2023   Unspecified mood (affective) disorder (HCC) 06/17/2023   Screen for STD (sexually transmitted disease) 06/24/2016   Vaginal discharge 06/24/2016   Labial lesion 06/24/2016   Sebaceous cyst 09/09/2015   Influenza vaccination declined by patient 05/10/2015   Threatened abortion in first trimester 02/13/2015   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 No pertinent surgical history.  Family History  Problem Relation Age of  Onset   Alcohol abuse Mother    Depression Mother    Drug abuse Mother    Mental illness Mother    Alcohol abuse Father    Arthritis Father    Depression Father    Drug abuse Father    Hypertension Father    Hypertension Paternal Uncle    Alcohol abuse Maternal Grandmother    Depression Maternal Grandmother    Drug abuse Maternal Grandmother    Mental illness Maternal Grandmother    Alcohol abuse Maternal Grandfather    Drug abuse Maternal Grandfather    Arthritis Paternal Grandmother    Asthma Paternal Grandmother    Cancer Paternal Grandmother    COPD Paternal Grandmother    Hyperlipidemia Paternal Grandmother     Social History   Tobacco Use   Smoking status: Former    Current packs/day: 0.00    Average packs/day: 1 pack/day for 2.0 years (2.0 ttl pk-yrs)    Types: Cigarettes    Start date: 02/21/2014    Quit date: 02/22/2016    Years since quitting: 7.4   Smokeless tobacco: Never  Substance Use Topics   Alcohol use: No    Alcohol/week: 0.0 standard drinks of alcohol     Current Outpatient Medications:    albuterol (VENTOLIN HFA) 108 (90 Base) MCG/ACT inhaler, Inhale into the lungs., Disp: , Rfl:    Ashwagandha 300 MG TABS, Take 350 mg by mouth daily as needed., Disp: , Rfl:    clotrimazole-betamethasone (LOTRISONE) cream, Apply 1 Application topically daily., Disp: 30 g, Rfl: 0   Lido-Menthol-Methyl Sal-Camph (CBD KINGS EX), Apply topically., Disp: , Rfl:    prazosin (MINIPRESS) 1 MG capsule, Take 1 capsule (1 mg total) by mouth at bedtime., Disp: 30 capsule, Rfl: 0   ALPRAZolam (XANAX) 0.25 MG tablet, Take 1 tablet (0.25 mg total) by mouth 2 (two) times daily as needed for anxiety., Disp: 30 tablet, Rfl: 0  Allergies  Allergen Reactions   Amoxicillin Hives   Penicillins Hives    I personally reviewed active problem list, medication list, allergies, health maintenance, notes from last encounter, lab results with the patient/caregiver today.   Review of  Systems  Psychiatric/Behavioral:  Positive for depression. The patient is nervous/anxious.       Objective  Vitals:   07/14/23 0936  BP: 114/72  Pulse: 92  Resp: 14  Temp: 98.5 F (36.9 C)  TempSrc: Oral  SpO2: 99%  Weight: 145 lb 8 oz (66 kg)  Height: 5\' 4"  (1.626 m)  Body mass index is 24.98 kg/m.  Physical Exam Vitals reviewed.  Constitutional:      General: She is awake.     Appearance: Normal appearance. She is well-developed and well-groomed.  HENT:     Head: Normocephalic and atraumatic.  Eyes:     General: Lids are normal. Gaze aligned appropriately.     Extraocular Movements: Extraocular movements intact.     Conjunctiva/sclera: Conjunctivae normal.  Pulmonary:     Effort: Pulmonary effort is normal.  Neurological:     General: No focal deficit present.     Mental Status: She is alert and oriented to person, place, and time.     GCS: GCS eye subscore is 4. GCS verbal subscore is 5. GCS motor subscore is 6.     Cranial Nerves: No cranial nerve deficit, dysarthria or facial asymmetry.  Psychiatric:        Attention and Perception: Attention and perception normal.        Mood and Affect: Mood and affect normal.        Speech: Speech normal.        Behavior: Behavior normal. Behavior is cooperative.      Recent Results (from the past 2160 hour(s))  COMPLETE METABOLIC PANEL WITH GFR     Status: None   Collection Time: 06/14/23  1:41 PM  Result Value Ref Range   Glucose, Bld 89 65 - 99 mg/dL    Comment: .            Fasting reference interval .    BUN 14 7 - 25 mg/dL   Creat 2.95 6.21 - 3.08 mg/dL   eGFR 657 > OR = 60 QI/ONG/2.95M8   BUN/Creatinine Ratio SEE NOTE: 6 - 22 (calc)    Comment:    Not Reported: BUN and Creatinine are within    reference range. .    Sodium 140 135 - 146 mmol/L   Potassium 4.5 3.5 - 5.3 mmol/L   Chloride 106 98 - 110 mmol/L   CO2 26 20 - 32 mmol/L   Calcium 9.4 8.6 - 10.2 mg/dL   Total Protein 6.9 6.1 - 8.1 g/dL    Albumin 4.3 3.6 - 5.1 g/dL   Globulin 2.6 1.9 - 3.7 g/dL (calc)   AG Ratio 1.7 1.0 - 2.5 (calc)   Total Bilirubin 0.4 0.2 - 1.2 mg/dL   Alkaline phosphatase (APISO) 55 31 - 125 U/L   AST 10 10 - 30 U/L   ALT 9 6 - 29 U/L  TSH     Status: None   Collection Time: 06/14/23  1:41 PM  Result Value Ref Range   TSH 1.57 mIU/L    Comment:           Reference Range .           > or = 20 Years  0.40-4.50 .                Pregnancy Ranges           First trimester    0.26-2.66           Second trimester   0.55-2.73           Third trimester    0.43-2.91   Cervicovaginal ancillary only     Status: None   Collection Time: 06/17/23 11:51 AM  Result Value Ref Range   Bacterial Vaginitis (gardnerella) Negative    Candida Vaginitis Negative    Candida Glabrata Negative  Comment      Normal Reference Range Bacterial Vaginosis - Negative   Comment Normal Reference Range Candida Species - Negative    Comment Normal Reference Range Candida Galbrata - Negative      PHQ2/9:    07/14/2023    9:38 AM 06/29/2023    9:13 AM 06/17/2023   11:25 AM 06/14/2023    1:02 PM 05/15/2020    7:40 AM  Depression screen PHQ 2/9  Decreased Interest 2 1 1 2  0  Down, Depressed, Hopeless 2 1 2 1  0  PHQ - 2 Score 4 2 3 3  0  Altered sleeping 3 1 2  0   Tired, decreased energy 2 1 3  0   Change in appetite 0 1 1 0   Feeling bad or failure about yourself  1 0 1 0   Trouble concentrating 0 1 3 0   Moving slowly or fidgety/restless 0 0 0 0   Suicidal thoughts 1 0 0 0   PHQ-9 Score 11 6 13 3    Difficult doing work/chores Very difficult Very difficult Very difficult Somewhat difficult       Fall Risk:    07/14/2023    9:37 AM 06/29/2023    9:13 AM 06/17/2023   11:24 AM 06/14/2023    1:01 PM 09/17/2021    2:21 PM  Fall Risk   Falls in the past year? 1 1 0 1 0  Number falls in past yr: 1 1 0 1 0  Injury with Fall? 1 0 0 1 0  Risk for fall due to : History of fall(s) Impaired balance/gait  Impaired  balance/gait   Follow up Falls prevention discussed;Falls evaluation completed;Education provided Falls prevention discussed;Education provided;Falls evaluation completed  Falls prevention discussed;Education provided;Falls evaluation completed Falls evaluation completed      Functional Status Survey: Is the patient deaf or have difficulty hearing?: No Does the patient have difficulty seeing, even when wearing glasses/contacts?: No Does the patient have difficulty concentrating, remembering, or making decisions?: Yes Does the patient have difficulty walking or climbing stairs?: No Does the patient have difficulty dressing or bathing?: No Does the patient have difficulty doing errands alone such as visiting a doctor's office or shopping?: No    Assessment & Plan  Problem List Items Addressed This Visit       Other   Anxiety - Primary    Chronic, ongoing  Appears mildly improved since previous visit - she still reports panic and agoraphobia today  GAD7 is 16 today, improved from 21 at last apt She has apt with therapist and psychiatry at the beginning of Dec  Will continue with Xanax 0.25 mg PO BID PRN for now- may take half to whole tablet as needed for anxiety and panic Reviewed that this would be better augmented with maintenance medication- will wait and defer to Psychiatry recommendations given her previous side effects with SSRI and hesitancy to start daily med at last apt Follow-up in 4 weeks or sooner if concerns arise      Relevant Medications   ALPRAZolam (XANAX) 0.25 MG tablet   Moderate episode of recurrent major depressive disorder (HCC)    Chronic, historic condition, ongoing Patient reports some difficulty remaining with regards to anxiety and depression She has appointment coming up with therapy and psychiatry early in December.  Highly encouraged her to keep these appointments For now we will continue with current medication regimen and will defer to psychiatry  recommendations once available We reviewed PHQ-9 and GAD-7  together today.  Some mild fluctuation with PHQ-9 but there is improvement on GAD-7 Follow-up in about 4 weeks or sooner if concerns arise      Relevant Medications   ALPRAZolam (XANAX) 0.25 MG tablet   Insomnia due to other mental disorder    Unsure of chronicity but likely acute on chronic She reports that she has not been taking trazodone and melatonin due to grogginess and bad nightmares She states that she is getting about 4 to 6 hours per night right now is trying to start a bedtime routine to assist with relaxation Given her treatment sleep disturbance I recommend trying prazosin 1 mg p.o. nightly Follow-up in about 6 weeks to discuss results      Relevant Medications   prazosin (MINIPRESS) 1 MG capsule   Controlled substance agreement signed    Controlled substance agreement signed for Xanax 0.25 mg PO BID PRN - may take half -whole tablet as needed for anxiety and panic attack See scanned document for further information         Return in about 4 weeks (around 08/11/2023) for anxiety, Depression,insomnia.   I, Aleksi Brummet E Stevan Eberwein, PA-C, have reviewed all documentation for this visit. The documentation on 07/18/23 for the exam, diagnosis, procedures, and orders are all accurate and complete.   Jacquelin Hawking, MHS, PA-C [facility]

## 2023-07-18 NOTE — Assessment & Plan Note (Signed)
Chronic, historic condition, ongoing Patient reports some difficulty remaining with regards to anxiety and depression She has appointment coming up with therapy and psychiatry early in December.  Highly encouraged her to keep these appointments For now we will continue with current medication regimen and will defer to psychiatry recommendations once available We reviewed PHQ-9 and GAD-7 together today.  Some mild fluctuation with PHQ-9 but there is improvement on GAD-7 Follow-up in about 4 weeks or sooner if concerns arise

## 2023-07-18 NOTE — Assessment & Plan Note (Signed)
Unsure of chronicity but likely acute on chronic She reports that she has not been taking trazodone and melatonin due to grogginess and bad nightmares She states that she is getting about 4 to 6 hours per night right now is trying to start a bedtime routine to assist with relaxation Given her treatment sleep disturbance I recommend trying prazosin 1 mg p.o. nightly Follow-up in about 6 weeks to discuss results

## 2023-07-28 NOTE — Telephone Encounter (Signed)
Copied from CRM [id number]. Topic: General - Other >> Jul 27, 2023  3:40 PM Everette C wrote: Reason for CRM: The patient has called to notify their PCP that their Psychiatrist Dr. Thomos Lemons MD will be handling their behavioral health medications  Please contact further if needed

## 2023-08-05 ENCOUNTER — Ambulatory Visit: Payer: Self-pay | Admitting: *Deleted

## 2023-08-05 NOTE — Telephone Encounter (Signed)
  Chief Complaint: pain redness swelling left knee Symptoms: see above. Limping can walk . Difficulty climbing stairs. Taking tylenol  Frequency: 5 days  Pertinent Negatives: Patient denies chest pain no difficulty breathing no fever Disposition: [] ED /[] Urgent Care (no appt availability in office) / [x] Appointment(In office/virtual)/ []  [facility]/ [] Home Care/ [] Refused Recommended Disposition /[] [facility]/ []  Follow-up with PCP Additional Notes:   Appt scheduled for 08/06/23.      Reason for Disposition  [1] Redness of the skin AND [2] no fever  Answer Assessment - Initial Assessment Questions 1. LOCATION: "Where is the swelling located?"  (e.g., left, right, both knees)     Left knee more swollen than right  2. ONSET: "When did the swelling start?" "Does it come and go, or is it there all the time?"     5 day ago  3. SWELLING: "How bad is the swelling?" Or, "How large is it?" (e.g., mild, moderate, severe; size of localized swelling)    - NONE: No joint swelling.   - LOCALIZED: Localized; small area of puffy or swollen skin (e.g., insect bite, skin irritation).   - MILD: Joint looks or feels mildly swollen or puffy.   - MODERATE: Swollen; interferes with normal activities (e.g., work or school); can't move joint normally (bend and straighten completely); may be limping.   - SEVERE: Very swollen; can't move swollen joint at all; limping a lot or unable to walk.     Limping at times. difficulty walking without taking tylenol  4. PAIN: "Is there any pain?" If Yes, ask: "How bad is it?" (Scale 1-10; or mild, moderate, severe)   - NONE (0): no pain.   - MILD (1-3): doesn't interfere with normal activities.    - MODERATE (4-7): interferes with normal activities (e.g., work or school) or awakens from sleep, limping.    - SEVERE (8-10): excruciating pain, unable to do any normal activities, unable to walk.      Pain level 7  5. SETTING: "Has there been any  recent work, exercise or other activity that involved that part of the body?"      Weather  6. AGGRAVATING FACTORS: "What makes the knee swelling worse?" (e.g., walking, climbing stairs, running)     Pain with climbing stairs , walking  7. ASSOCIATED SYMPTOMS: "Is there any pain or redness?"     Pain and swelling redness due to psorais. And bruising  8. OTHER SYMPTOMS: "Do you have any other symptoms?" (e.g., chest pain, difficulty breathing, fever, calf pain)     Left knee and ankle  swelling redness from psoriasis  9. PREGNANCY: "Is there any chance you are pregnant?" "When was your last menstrual period?"     Na  Protocols used: Knee Swelling-A-AH

## 2023-08-06 ENCOUNTER — Ambulatory Visit: Payer: Medicaid Other | Admitting: Physician Assistant

## 2023-08-13 ENCOUNTER — Ambulatory Visit: Payer: Medicaid Other | Admitting: Physician Assistant

## 2023-09-07 ENCOUNTER — Other Ambulatory Visit: Payer: Self-pay | Admitting: Medical Genetics

## 2023-10-06 ENCOUNTER — Other Ambulatory Visit: Payer: Medicaid Other | Attending: Medical Genetics

## 2023-10-06 ENCOUNTER — Telehealth: Payer: Medicaid Other | Admitting: Physician Assistant

## 2023-10-06 DIAGNOSIS — R3989 Other symptoms and signs involving the genitourinary system: Secondary | ICD-10-CM | POA: Diagnosis not present

## 2023-10-06 MED ORDER — NITROFURANTOIN MONOHYD MACRO 100 MG PO CAPS: 100.0000 mg | ORAL_CAPSULE | Freq: Two times a day (BID) | ORAL | 0 refills | Status: DC

## 2023-10-06 NOTE — Patient Instructions (Signed)
 Emma Edwards, thank you for joining Margaretann Loveless, PA-C for today's virtual visit.  While this provider is not your primary care provider (PCP), if your PCP is located in our provider database this encounter information will be shared with them immediately following your visit.   A [facility] MyChart account gives you access to today's visit and all your visits, tests, and labs performed at [facility] " click here if you don't have a [facility] MyChart account or go to [email or web address]  Consent: (Patient) Emma Edwards provided verbal consent for this virtual visit at the beginning of the encounter.  Current Medications:  Current Outpatient Medications:    nitrofurantoin, macrocrystal-monohydrate, (MACROBID) 100 MG capsule, Take 1 capsule (100 mg total) by mouth 2 (two) times daily., Disp: 10 capsule, Rfl: 0   albuterol (VENTOLIN HFA) 108 (90 Base) MCG/ACT inhaler, Inhale into the lungs., Disp: , Rfl:    ALPRAZolam (XANAX) 0.25 MG tablet, Take 1 tablet (0.25 mg total) by mouth 2 (two) times daily as needed for anxiety., Disp: 30 tablet, Rfl: 0   Ashwagandha 300 MG TABS, Take 350 mg by mouth daily as needed., Disp: , Rfl:    clotrimazole-betamethasone (LOTRISONE) cream, Apply 1 Application topically daily., Disp: 30 g, Rfl: 0   Lido-Menthol-Methyl Sal-Camph (CBD KINGS EX), Apply topically., Disp: , Rfl:    prazosin (MINIPRESS) 1 MG capsule, Take 1 capsule (1 mg total) by mouth at bedtime., Disp: 30 capsule, Rfl: 0   Medications ordered in this encounter:  Meds ordered this encounter  Medications   nitrofurantoin, macrocrystal-monohydrate, (MACROBID) 100 MG capsule    Sig: Take 1 capsule (100 mg total) by mouth 2 (two) times daily.    Dispense:  10 capsule    Refill:  0    Supervising Provider:   Merrilee Jansky [[id number]]     *If you need refills on other medications prior to your next appointment, please contact your  pharmacy*  Follow-Up: Call back or seek an in-person evaluation if the symptoms worsen or if the condition fails to improve as anticipated.  [facility] [phone]  Other Instructions Urinary Tract Infection, Female A urinary tract infection (UTI) is an infection in your urinary tract. The urinary tract is made up of organs that make, store, and get rid of pee (urine) in your body. These organs include: The kidneys. The ureters. The bladder. The urethra. What are the causes? Most UTIs are caused by germs called bacteria. They may be in or near your genitals. These germs grow and cause swelling in your urinary tract. What increases the risk? You're more likely to get a UTI if: You're a female. The urethra is shorter in females than in males. You have a soft tube called a catheter that drains your pee. You can't control when you pee or poop. You have trouble peeing because of: A kidney stone. A urinary blockage. A nerve condition that affects your bladder. Not getting enough to drink. You're sexually active. You use a birth control inside your vagina, like spermicide. You're pregnant. You have low levels of the hormone estrogen in your body. You're an older adult. You're also more likely to get a UTI if you have other health problems. These may include: Diabetes. A weak immune system. Your immune system is your body's defense system. Sickle cell disease. Injury of the spine. What are the signs or symptoms? Symptoms may include: Needing to pee right away. Peeing small amounts often. Pain or  burning when you pee. Blood in your pee. Pee that smells bad or odd. Pain in your belly or lower back. You may also: Feel confused. This may be the first symptom in older adults. Vomit. Not feel hungry. Feel tired or easily annoyed. Have a fever or chills. How is this diagnosed? A UTI is diagnosed based on your medical history and an exam. You may also have other  tests. These may include: Pee tests. Blood tests. Tests for sexually transmitted infections (STIs). If you've had more than one UTI, you may need to have imaging studies done to find out why you keep getting them. How is this treated? A UTI can be treated by: Taking antibiotics or other medicines. Drinking enough fluid to keep your pee pale yellow. In rare cases, a UTI can cause a very bad condition called sepsis. Sepsis may be treated in the hospital. Follow these instructions at home: Medicines Take your medicines only as told by your health care provider. If you were given antibiotics, take them as told by your provider. Do not stop taking them even if you start to feel better. General instructions Make sure you: Pee often and fully. Do not hold your pee for a long time. Wipe from front to back after you pee or poop. Use each tissue only once when you wipe. Pee after you have sex. Do not douche or use sprays or powders in your genital area. Contact a health care provider if: Your symptoms don't get better after 1-2 days of taking antibiotics. Your symptoms go away and then come back. You have a fever or chills. You vomit or feel like you may vomit. Get help right away if: You have very bad pain in your back or lower belly. You faint. This information is not intended to replace advice given to you by your health care provider. Make sure you discuss any questions you have with your health care provider. Document Revised: 03/18/2023 Document Reviewed: 11/13/2022 Elsevier Patient Education  2024 [employer].   If you have been instructed to have an in-person evaluation today at a local Urgent Care facility, please use the link below. It will take you to a list of all of our available [facility], including address, phone number and hours of operation. Please do not delay care.  [facility]  If you or a family member do not have a primary care provider,  use the link below to schedule a visit and establish care. When you choose a [facility] primary care physician or advanced practice provider, you gain a long-term partner in health. Find a Primary Care Provider  Learn more about [facility] in-office and virtual care options: [facility] - Get Care Now

## 2023-10-06 NOTE — Progress Notes (Signed)
Virtual Visit Consent   Emma Edwards, you are scheduled for a virtual visit with a [facility] provider today. Just as with appointments in the office, your consent must be obtained to participate. Your consent will be active for this visit and any virtual visit you may have with one of our providers in the next 365 days. If you have a MyChart account, a copy of this consent can be sent to you electronically.  As this is a virtual visit, video technology does not allow for your provider to perform a traditional examination. This may limit your provider's ability to fully assess your condition. If your provider identifies any concerns that need to be evaluated in person or the need to arrange testing (such as labs, EKG, etc.), we will make arrangements to do so. Although advances in technology are sophisticated, we cannot ensure that it will always work on either your end or our end. If the connection with a video visit is poor, the visit may have to be switched to a telephone visit. With either a video or telephone visit, we are not always able to ensure that we have a secure connection.  By engaging in this virtual visit, you consent to the provision of healthcare and authorize for your insurance to be billed (if applicable) for the services provided during this visit. Depending on your insurance coverage, you may receive a charge related to this service.  I need to obtain your verbal consent now. Are you willing to proceed with your visit today? Emma Edwards has provided verbal consent on 10/06/2023 for a virtual visit (video or telephone). Emma Loveless, PA-C  Date: 10/06/2023 7:48 AM   Virtual Visit via Video Note   I, Emma Edwards, connected with  Emma Edwards  ([id number], [birth year]) on 10/06/23 at  7:45 AM EST by a video-enabled telemedicine application and verified that I am speaking with the correct person using two identifiers.  Location: Patient: Virtual  Visit Location Patient: Home Provider: Virtual Visit Location Provider: Home Office   I discussed the limitations of evaluation and management by telemedicine and the availability of in person appointments. The patient expressed understanding and agreed to proceed.    History of Present Illness: Emma Edwards is a 28 y.o. who identifies as a female who was assigned female at birth, and is being seen today for dysuria.  HPI: Urinary Tract Infection  This is a new problem. The current episode started today. The problem occurs every urination. The problem has been gradually worsening. The quality of the pain is described as burning and aching. The pain is mild. There has been no fever. Associated symptoms include hesitancy and urgency. Pertinent negatives include no chills, discharge, flank pain, frequency, hematuria, nausea or vomiting. Associated symptoms comments: Suprapubic pressure. She has tried increased fluids for the symptoms. The treatment provided no relief.     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   Surveillance for birth control, oral  contraceptives 02/07/2015   History of kidney stones 02/07/2015   History of migraine headaches 02/07/2015   Menorrhagia with regular cycle 02/07/2015   Mood changes 02/07/2015    Allergies:  Allergies  Allergen Reactions   Amoxicillin Hives   Penicillins Hives    Medications:  Current Outpatient Medications:    nitrofurantoin, macrocrystal-monohydrate, (MACROBID) 100 MG capsule, Take 1 capsule (100 mg total) by mouth 2 (two) times daily., Disp: 10 capsule, Rfl: 0   albuterol (VENTOLIN HFA) 108 (90 Base) MCG/ACT inhaler, Inhale into the lungs., Disp: , Rfl:    ALPRAZolam (XANAX) 0.25 MG tablet, Take 1 tablet (0.25 mg total) by mouth 2 (two) times daily as needed for anxiety., Disp: 30 tablet, Rfl: 0   Ashwagandha 300 MG TABS, Take 350 mg by mouth daily as needed., Disp: , Rfl:    clotrimazole-betamethasone (LOTRISONE) cream, Apply 1 Application topically daily., Disp: 30 g, Rfl: 0   Lido-Menthol-Methyl Sal-Camph (CBD KINGS EX), Apply topically., Disp: , Rfl:    prazosin (MINIPRESS) 1 MG capsule, Take 1 capsule (1 mg total) by mouth at bedtime., Disp: 30 capsule, Rfl: 0  Observations/Objective: Patient is well-developed, well-nourished in no acute distress.  Resting comfortably at home.  Head is normocephalic, atraumatic.  No labored breathing.  Speech is clear and coherent with logical content.  Patient is alert and oriented at baseline.    Assessment and Plan: 1. Suspected UTI (Primary) - nitrofurantoin, macrocrystal-monohydrate, (MACROBID) 100 MG capsule; Take 1 capsule (100 mg total) by mouth 2 (two) times daily.  Dispense: 10 capsule; Refill: 0  - Worsening symptoms.  - Will treat empirically with Macrobid - May use AZO for bladder spasms - Continue to push fluids.  - Seek in person evaluation for urine culture if symptoms do not improve or if they worsen.    Follow Up Instructions: I discussed the assessment and treatment plan with the patient. The patient was provided an opportunity to ask questions and all were answered. The patient agreed with the plan and demonstrated an understanding of the instructions.  A copy of instructions were sent to the patient via MyChart unless otherwise noted below.    The patient was advised to call  back or seek an in-person evaluation if the symptoms worsen or if the condition fails to improve as anticipated.    Emma Loveless, PA-C

## 2023-10-13 ENCOUNTER — Telehealth: Payer: Medicaid Other | Admitting: Physician Assistant

## 2023-10-13 DIAGNOSIS — J069 Acute upper respiratory infection, unspecified: Secondary | ICD-10-CM | POA: Diagnosis not present

## 2023-10-13 MED ORDER — PSEUDOEPH-BROMPHEN-DM 30-2-10 MG/5ML PO SYRP: 5.0000 mL | ORAL_SOLUTION | Freq: Four times a day (QID) | ORAL | 0 refills | Status: DC | PRN

## 2023-10-13 MED ORDER — FLUTICASONE PROPIONATE 50 MCG/ACT NA SUSP: 2.0000 | Freq: Every day | NASAL | 0 refills | Status: DC

## 2023-10-13 MED ORDER — ALBUTEROL SULFATE HFA 108 (90 BASE) MCG/ACT IN AERS: 1.0000 | INHALATION_SPRAY | Freq: Four times a day (QID) | RESPIRATORY_TRACT | 0 refills | Status: DC | PRN

## 2023-10-13 NOTE — Progress Notes (Signed)
 E-Visit for Upper Respiratory Infection   We are sorry you are not feeling well.  Here is how we plan to help!  Based on what you have shared with me, it looks like you may have a viral upper respiratory infection.  Upper respiratory infections are caused by a large number of viruses; however, rhinovirus is the most common cause.   Symptoms vary from person to person, with common symptoms including sore throat, cough, fatigue or lack of energy and feeling of general discomfort.  A low-grade fever of up to 100.4 may present, but is often uncommon.  Symptoms vary however, and are closely related to a person's age or underlying illnesses.  The most common symptoms associated with an upper respiratory infection are nasal discharge or congestion, cough, sneezing, headache and pressure in the ears and face.  These symptoms usually persist for about 3 to 10 days, but can last up to 2 weeks.  It is important to know that upper respiratory infections do not cause serious illness or complications in most cases.    Upper respiratory infections can be transmitted from person to person, with the most common method of transmission being a person's hands.  The virus is able to live on the skin and can infect other persons for up to 2 hours after direct contact.  Also, these can be transmitted when someone coughs or sneezes; thus, it is important to cover the mouth to reduce this risk.  To keep the spread of the illness at bay, good hand hygiene is very important.  This is an infection that is most likely caused by a virus. There are no specific treatments other than to help you with the symptoms until the infection runs its course.  We are sorry you are not feeling well.  Here is how we plan to help!   For nasal congestion, you may use an oral decongestants such as Mucinex D or if you have glaucoma or high blood pressure use plain Mucinex.  Saline nasal spray or nasal drops can help and can safely be used as often as  needed for congestion.  For your congestion, I have prescribed Fluticasone nasal spray one spray in each nostril twice a day  If you do not have a history of heart disease, hypertension, diabetes or thyroid disease, prostate/bladder issues or glaucoma, you may also use Sudafed to treat nasal congestion.  It is highly recommended that you consult with a pharmacist or your primary care physician to ensure this medication is safe for you to take.     For cough I have prescribed for you Bromfed DM cough syrup Take 5mL every 6 hours as needed for cough.  I have also prescribed Albuterol inhaler Use 1-2 puffs every 6 hours as needed for shortness of breath, chest tightness, and/or wheezing.  If you have a sore or scratchy throat, use a saltwater gargle-  to  teaspoon of salt dissolved in a 4-ounce to 8-ounce glass of warm water.  Gargle the solution for approximately 15-30 seconds and then spit.  It is important not to swallow the solution.  You can also use throat lozenges/cough drops and Chloraseptic spray to help with throat pain or discomfort.  Warm or cold liquids can also be helpful in relieving throat pain.  For headache, pain or general discomfort, you can use Ibuprofen or Tylenol as directed.   Some authorities believe that zinc sprays or the use of Echinacea may shorten the course of your symptoms.  HOME CARE Only take medications as instructed by your medical team. Be sure to drink plenty of fluids. Water is fine as well as fruit juices, sodas and electrolyte beverages. You may want to stay away from caffeine or alcohol. If you are nauseated, try taking small sips of liquids. How do you know if you are getting enough fluid? Your urine should be a pale yellow or almost colorless. Get rest. Taking a steamy shower or using a humidifier may help nasal congestion and ease sore throat pain. You can place a towel over your head and breathe in the steam from hot water coming from a faucet. Using a  saline nasal spray works much the same way. Cough drops, hard candies and sore throat lozenges may ease your cough. Avoid close contacts especially the very young and the elderly Cover your mouth if you cough or sneeze Always remember to wash your hands.   GET HELP RIGHT AWAY IF: You develop worsening fever. If your symptoms do not improve within 10 days You develop yellow or green discharge from your nose over 3 days. You have coughing fits You develop a severe head ache or visual changes. You develop shortness of breath, difficulty breathing or start having chest pain Your symptoms persist after you have completed your treatment plan  MAKE SURE YOU  Understand these instructions. Will watch your condition. Will get help right away if you are not doing well or get worse.  Thank you for choosing an e-visit.  Your e-visit answers were reviewed by a board certified advanced clinical practitioner to complete your personal care plan. Depending upon the condition, your plan could have included both over the counter or prescription medications.  Please review your pharmacy choice. Make sure the pharmacy is open so you can pick up prescription now. If there is a problem, you may contact your provider through [employer] and have the prescription routed to another pharmacy.  Your safety is important to [place]. If you have drug allergies check your prescription carefully.   For the next 24 hours you can use MyChart to ask questions about today's visit, request a non-urgent call back, or ask for a work or school excuse. You will get an email in the next two days asking about your experience. I hope that your e-visit has been valuable and will speed your recovery.     I have spent 5 minutes in review of e-visit questionnaire, review and updating patient chart, medical decision making and response to patient.   Margaretann Loveless, PA-C

## 2023-10-26 LAB — TSH: TSH: 1.27 (ref 0.41–5.90)

## 2023-10-26 LAB — BASIC METABOLIC PANEL
BUN: 18 (ref 4–21)
CO2: 25 — AB (ref 13–22)
Chloride: 103 (ref 99–108)
Creatinine: 0.8 (ref 0.5–1.1)
Glucose: 83
Potassium: 4.4 meq/L (ref 3.5–5.1)
Sodium: 141 (ref 137–147)

## 2023-10-26 LAB — COMPREHENSIVE METABOLIC PANEL
Albumin: 4.4 (ref 3.5–5.0)
Calcium: 9.3 (ref 8.7–10.7)
Globulin: 2.5
eGFR: 97

## 2023-10-26 LAB — HEPATIC FUNCTION PANEL
ALT: 13 U/L (ref 7–35)
AST: 9 — AB (ref 13–35)
Alkaline Phosphatase: 85 (ref 25–125)
Bilirubin, Total: 0.3

## 2023-10-26 LAB — IRON,TIBC AND FERRITIN PANEL
%SAT: 77
Iron: 213
TIBC: 278
UIBC: 65

## 2023-10-26 LAB — VITAMIN B12: Vitamin B-12: 691

## 2023-10-27 LAB — LAB REPORT - SCANNED: EGFR: 97

## 2023-10-28 ENCOUNTER — Telehealth: Admitting: Family Medicine

## 2023-10-28 DIAGNOSIS — R3989 Other symptoms and signs involving the genitourinary system: Secondary | ICD-10-CM

## 2023-10-28 MED ORDER — NITROFURANTOIN MONOHYD MACRO 100 MG PO CAPS: 100.0000 mg | ORAL_CAPSULE | Freq: Two times a day (BID) | ORAL | 0 refills | Status: AC

## 2023-10-28 NOTE — Progress Notes (Signed)

## 2023-11-04 ENCOUNTER — Ambulatory Visit: Admitting: Family Medicine

## 2023-11-08 ENCOUNTER — Ambulatory Visit: Admitting: Family Medicine

## 2023-11-10 ENCOUNTER — Ambulatory Visit: Admitting: Family Medicine

## 2023-11-12 ENCOUNTER — Encounter: Payer: Self-pay | Admitting: Family Medicine

## 2023-11-12 ENCOUNTER — Ambulatory Visit (INDEPENDENT_AMBULATORY_CARE_PROVIDER_SITE_OTHER): Admitting: Family Medicine

## 2023-11-12 VITALS — BP 110/80 | HR 94 | Temp 98.3°F | Resp 16 | Ht 64.0 in | Wt 161.6 lb

## 2023-11-12 DIAGNOSIS — M2550 Pain in unspecified joint: Secondary | ICD-10-CM | POA: Diagnosis not present

## 2023-11-12 DIAGNOSIS — R899 Unspecified abnormal finding in specimens from other organs, systems and tissues: Secondary | ICD-10-CM

## 2023-11-12 DIAGNOSIS — L409 Psoriasis, unspecified: Secondary | ICD-10-CM

## 2023-11-12 DIAGNOSIS — R222 Localized swelling, mass and lump, trunk: Secondary | ICD-10-CM | POA: Diagnosis not present

## 2023-11-12 MED ORDER — MELOXICAM 15 MG PO TABS: 7.5000 mg | ORAL_TABLET | Freq: Every day | ORAL | 1 refills | Status: DC

## 2023-11-12 NOTE — Progress Notes (Signed)
 Patient ID: Emma Edwards, female    DOB: Aug 31, 1995, 28 y.o.   MRN: [id number]  PCP: [name], PA-C  Chief Complaint  Patient presents with   Cyst on back   Iron Levels   Referral to Rheumatology    Subjective:   Emma Edwards is a 28 y.o. female, presents to clinic with CC of the following:  HPI  Here for multiple acute complaints Concerns of high iron labs done through psychiatry  Hemoglobin  Date Value Ref Range Status  02/13/2015 15.0 12.0 - 16.0 g/dL Final  Will obtain labs from [facility] High iron "taking in low amounts of iron and body is holding onto high amounts of iron" No on iron supplement Psych Dellis Filbert, MD psychiatry   Menstrual cycles heavy first 2 days, lasts 5 d, 30 d cycle  She uses reusable pads - 2 a day - they hold more than regular OTC disposable  Miscarriage last year about Feb 2024 with anemia, and then most recent CBC was oct    She did a referral to [facility] rheumatology for psoriasis due to concerns of psoriatic arthritis sx [facility] - not currently on rx meds  Joint pain/concern for arhtritis knees, Right knee swells, both ankles and right hand Previously got mobic from UC and it was helpful  Nodule/cyst to back mid back upper left of spine - 2x1.5 cm onset about a year ago, no change no pain looking for faster referral        Patient Active Problem List   Diagnosis Date Noted   Moderate episode of recurrent major depressive disorder (HCC) 07/14/2023   Insomnia due to other mental disorder 07/14/2023   Controlled substance agreement signed 07/14/2023   Anxiety 06/29/2023   Unspecified mood (affective) disorder (HCC) 06/17/2023   Screen for STD (sexually transmitted disease) 06/24/2016   Vaginal discharge 06/24/2016   Labial lesion 06/24/2016   Sebaceous cyst 09/09/2015   Influenza vaccination declined by patient 05/10/2015   Threatened abortion in first trimester 02/13/2015   Psoriasiform dermatitis  02/07/2015   Surveillance for birth control, oral contraceptives 02/07/2015   History of kidney stones 02/07/2015   History of migraine headaches 02/07/2015   Menorrhagia with regular cycle 02/07/2015   Mood changes 02/07/2015      Current Outpatient Medications:    albuterol (VENTOLIN HFA) 108 (90 Base) MCG/ACT inhaler, Inhale 1-2 puffs into the lungs every 6 (six) hours as needed., Disp: 8 g, Rfl: 0   ALPRAZolam (XANAX) 0.25 MG tablet, Take 1 tablet (0.25 mg total) by mouth 2 (two) times daily as needed for anxiety. (Patient taking differently: Take 0.25 mg by mouth 2 (two) times daily as needed for anxiety.), Disp: 30 tablet, Rfl: 0   clotrimazole-betamethasone (LOTRISONE) cream, Apply 1 Application topically daily., Disp: 30 g, Rfl: 0   fluticasone (FLONASE) 50 MCG/ACT nasal spray, Place 2 sprays into both nostrils daily., Disp: 16 g, Rfl: 0   hydrOXYzine (VISTARIL) 100 MG capsule, Take 100 mg by mouth 2 (two) times daily., Disp: , Rfl:    Lido-Menthol-Methyl Sal-Camph (CBD KINGS EX), Apply topically., Disp: , Rfl:    REXULTI 1 MG TABS tablet, Take 1 mg by mouth daily., Disp: , Rfl:    Ashwagandha 300 MG TABS, Take 350 mg by mouth daily as needed., Disp: , Rfl:    brompheniramine-pseudoephedrine-DM 30-2-10 MG/5ML syrup, Take 5 mLs by mouth 4 (four) times daily as needed., Disp: 120 mL, Rfl: 0   nitrofurantoin,  macrocrystal-monohydrate, (MACROBID) 100 MG capsule, Take 1 capsule (100 mg total) by mouth 2 (two) times daily., Disp: 10 capsule, Rfl: 0   prazosin (MINIPRESS) 1 MG capsule, Take 1 capsule (1 mg total) by mouth at bedtime., Disp: 30 capsule, Rfl: 0   Allergies  Allergen Reactions   Amoxicillin Hives   Penicillins Hives     Social History   Tobacco Use   Smoking status: Former    Current packs/day: 0.00    Average packs/day: 1 pack/day for 2.0 years (2.0 ttl pk-yrs)    Types: Cigarettes    Start date: 02/21/2014    Quit date: 02/22/2016    Years since quitting: 7.7    Smokeless tobacco: Never  Vaping Use   Vaping status: Never Used  Substance Use Topics   Alcohol use: No    Alcohol/week: 0.0 standard drinks of alcohol   Drug use: No      Chart Review Today: I personally reviewed active problem list, medication list, allergies, family history, social history, health maintenance, notes from last encounter, lab results, imaging with the patient/caregiver today.   Review of Systems  Constitutional: Negative.   HENT: Negative.    Eyes: Negative.   Respiratory: Negative.    Cardiovascular: Negative.   Gastrointestinal: Negative.   Endocrine: Negative.   Genitourinary: Negative.   Musculoskeletal: Negative.   Skin: Negative.   Allergic/Immunologic: Negative.   Neurological: Negative.   Hematological: Negative.   Psychiatric/Behavioral: Negative.    All other systems reviewed and are negative.      Objective:   Vitals:   11/12/23 1033  BP: 110/80  Pulse: 94  Resp: 16  Temp: 98.3 F (36.8 C)  TempSrc: Oral  SpO2: 98%  Weight: 161 lb 9.6 oz (73.3 kg)  Height: 5\' 4"  (1.626 m)    Body mass index is 27.74 kg/m.  Physical Exam Vitals and nursing note reviewed.  Constitutional:      General: She is not in acute distress.    Appearance: Normal appearance. She is well-developed. She is not ill-appearing, toxic-appearing or diaphoretic.  HENT:     Head: Normocephalic and atraumatic.     Nose: Nose normal.  Eyes:     General:        Right eye: No discharge.        Left eye: No discharge.     Conjunctiva/sclera: Conjunctivae normal.  Neck:     Trachea: No tracheal deviation.  Cardiovascular:     Rate and Rhythm: Normal rate and regular rhythm.  Pulmonary:     Effort: Pulmonary effort is normal. No respiratory distress.     Breath sounds: No stridor.  Musculoskeletal:        General: Normal range of motion.  Skin:    General: Skin is warm and dry.     Findings: Rash present.     Comments: 1.5 x 2 cm lump/nodule to upper  midline back, just left of spine, no ttp, no erythema or fluctuance  Neurological:     Mental Status: She is alert.     Motor: No abnormal muscle tone.     Coordination: Coordination normal.  Psychiatric:        Behavior: Behavior normal.      Results for orders placed or performed in visit on 06/17/23  Cervicovaginal ancillary only   Collection Time: 06/17/23 11:51 AM  Result Value Ref Range   Bacterial Vaginitis (gardnerella) Negative    Candida Vaginitis Negative    Candida Glabrata Negative  Comment      Normal Reference Range Bacterial Vaginosis - Negative   Comment Normal Reference Range Candida Species - Negative    Comment Normal Reference Range Candida Galbrata - Negative        Assessment & Plan:     ICD-10-CM   1. Polyarthralgia  M25.50 Ambulatory referral to Rheumatology   suspected psoriatic arthritis - referal from [facility] to rheumatology but long wait - asks for local referral, trial mobic and tylenol    2. Psoriasis  L40.9 Ambulatory referral to Rheumatology   managed by [facility], she needs rheumatology referral for suspected psoriatic arthritis    3. Nodule of skin of back  R22.2 Ambulatory referral to Dermatology   1.5 x 2 cm nodule suspected cyst to midline upper back, no redness, pt wants eval for excision    4. Abnormal laboratory test  R89.9    abnormal iron with labs done with psychiatry, hx of acute blood loss anemia 1 year ago, will obtain records to review labs     Requested records from [facility] and psychiatrist to see what the lab abnormalities are Last CBC in chart is Oct last year H/H normal.  Labs done only 3 weeks ago, will not repeat labs, will first attempt to get records and review If iron deficiency plan will be to supplement and f/up in 3 months If other abnormalities may need f/up work up or labs      [name], PA-C 11/12/23 10:51 AM

## 2023-11-15 ENCOUNTER — Encounter: Payer: Self-pay | Admitting: Family Medicine

## 2023-11-15 ENCOUNTER — Other Ambulatory Visit: Payer: Self-pay | Admitting: Family Medicine

## 2023-11-15 DIAGNOSIS — Z862 Personal history of diseases of the blood and blood-forming organs and certain disorders involving the immune mechanism: Secondary | ICD-10-CM

## 2023-11-15 DIAGNOSIS — R899 Unspecified abnormal finding in specimens from other organs, systems and tissues: Secondary | ICD-10-CM

## 2023-11-15 DIAGNOSIS — R790 Abnormal level of blood mineral: Secondary | ICD-10-CM

## 2023-11-15 NOTE — Progress Notes (Signed)
 Lab results abstracted from records received from [facility] [name], PA-C  Lab Results  Component Value Date   IRON 213 10/26/2023   TIBC 278 10/26/2023  UIBC 65 low Iron saturation 77 (high %)  Care everywhere H/H 12.5/36/1 Hemoglobin  Date Value Ref Range Status  02/13/2015 15.0 12.0 - 16.0 g/dL Final    Will need to reorder CBC with diff and iron panel with ferritin  B12 normal    ICD-10-CM   1. Abnormal laboratory test  R89.9 CBC with Differential/Platelet    Iron, TIBC and Ferritin Panel    2. Abnormal iron saturation  R79.0 CBC with Differential/Platelet    Iron, TIBC and Ferritin Panel    3. History of anemia  Z86.2 CBC with Differential/Platelet    Iron, TIBC and Ferritin Panel

## 2023-11-25 ENCOUNTER — Ambulatory Visit (INDEPENDENT_AMBULATORY_CARE_PROVIDER_SITE_OTHER): Admitting: Nurse Practitioner

## 2023-11-25 ENCOUNTER — Encounter: Payer: Self-pay | Admitting: Nurse Practitioner

## 2023-11-25 ENCOUNTER — Other Ambulatory Visit: Payer: Self-pay

## 2023-11-25 ENCOUNTER — Telehealth: Admitting: Family Medicine

## 2023-11-25 ENCOUNTER — Other Ambulatory Visit (HOSPITAL_COMMUNITY)
Admission: RE | Admit: 2023-11-25 | Discharge: 2023-11-25 | Disposition: A | Discharge status: Home / Self Care | Source: Ambulatory Visit | Attending: Nurse Practitioner | Admitting: Nurse Practitioner

## 2023-11-25 VITALS — BP 116/74 | HR 113 | Temp 97.9°F | Resp 16 | Ht 64.0 in | Wt 161.4 lb

## 2023-11-25 DIAGNOSIS — R300 Dysuria: Secondary | ICD-10-CM | POA: Insufficient documentation

## 2023-11-25 DIAGNOSIS — N390 Urinary tract infection, site not specified: Secondary | ICD-10-CM

## 2023-11-25 LAB — POCT URINALYSIS DIPSTICK
Bilirubin, UA: NEGATIVE
Blood, UA: POSITIVE
Glucose, UA: NEGATIVE
Ketones, UA: POSITIVE
Nitrite, UA: POSITIVE
Odor: NORMAL
Protein, UA: NEGATIVE
Spec Grav, UA: 1.02 (ref 1.010–1.025)
Urobilinogen, UA: 0.2 U/dL
pH, UA: 7 (ref 5.0–8.0)

## 2023-11-25 MED ORDER — SULFAMETHOXAZOLE-TRIMETHOPRIM 800-160 MG PO TABS: 1.0000 | ORAL_TABLET | Freq: Two times a day (BID) | ORAL | 0 refills | Status: AC

## 2023-11-25 NOTE — Progress Notes (Signed)
  Because you are having recurrent symptoms your condition warrants further evaluation- a urine sample collection it is recommend that you be seen in a face-to-face visit at your PCP office or local urgent care.   NOTE: There will be NO CHARGE for this E-Visit   If you are having a true medical emergency, please call 911.

## 2023-11-25 NOTE — Progress Notes (Signed)
 BP 116/74   Pulse (!) 113   Temp 97.9 F (36.6 C)   Resp 16   Ht 5\' 4"  (1.626 m)   Wt 161 lb 6.4 oz (73.2 kg)   LMP 11/11/2023   SpO2 97%   BMI 27.70 kg/m    Subjective:    Patient ID: Emma Edwards, female    DOB: Oct 24, 1995, 28 y.o.   MRN: [id number]  HPI: Emma Edwards is a 28 y.o. female  Chief Complaint  Patient presents with   Urinary Tract Infection    Recurrent, w/ bruning, frequency, pain, blood     Discussed the use of AI scribe software for clinical note transcription with the patient, who gave verbal consent to proceed.  History of Present Illness Emma Edwards is a 28 year old female with recurrent urinary tract infections who presents with painful urination.  This is her third episode of urinary tract infection in the past two months, with the current episode being very painful. Her last UTI occurred approximately three weeks ago. She has done multiple virtual visits for treatment but no urine culture has been done.  She has a history of multiple UTIs, estimating around twenty in her lifetime, and has been hospitalized for them in the past. She is familiar with the symptoms and management of UTIs.  She has a new sexual partner, which she believes may be contributing to the recurrence of UTIs.  She is allergic to penicillin and is unsure of the specific antibiotics she has been prescribed previously.  She mentions experiencing hematuria when she provided a sample. Her last menstrual cycle was approximately two weeks ago, on the twentieth of the previous month. No fever or vaginal discharge, except for some normal discharge associated with ovulation. She does have a new sexual partner.          11/12/2023   10:31 AM 07/14/2023    9:38 AM 06/29/2023    9:13 AM  Depression screen PHQ 2/9  Decreased Interest 1 2 1   Down, Depressed, Hopeless 0 2 1  PHQ - 2 Score 1 4 2   Altered sleeping 0 3 1  Tired, decreased energy 1 2 1   Change in appetite 0  0 1  Feeling bad or failure about yourself  0 1 0  Trouble concentrating 1 0 1  Moving slowly or fidgety/restless 0 0 0  Suicidal thoughts 0 1 0  PHQ-9 Score 3 11 6   Difficult doing work/chores Somewhat difficult Very difficult Very difficult    Relevant past medical, surgical, family and social history reviewed and updated as indicated. Interim medical history since our last visit reviewed. Allergies and medications reviewed and updated.  Review of Systems  Ten systems reviewed and is negative except as mentioned in HPI      Objective:    BP 116/74   Pulse (!) 113   Temp 97.9 F (36.6 C)   Resp 16   Ht 5\' 4"  (1.626 m)   Wt 161 lb 6.4 oz (73.2 kg)   LMP 11/11/2023   SpO2 97%   BMI 27.70 kg/m    Wt Readings from Last 3 Encounters:  11/25/23 161 lb 6.4 oz (73.2 kg)  11/12/23 161 lb 9.6 oz (73.3 kg)  07/14/23 145 lb 8 oz (66 kg)    Physical Exam Physical Exam GENERAL: Alert, cooperative, well developed, no acute distress. HEENT: Normocephalic, normal oropharynx, moist mucous membranes. CHEST: Clear to auscultation bilaterally, no wheezes, rhonchi, or crackles. CARDIOVASCULAR: Normal  heart rate and rhythm, S1 and S2 normal without murmurs. ABDOMEN: Soft, no abdominal tenderness, mild tenderness on right CVA, non-distended, without organomegaly, normal bowel sounds. EXTREMITIES: No cyanosis or edema. NEUROLOGICAL: Cranial nerves grossly intact, moves all extremities without gross motor or sensory deficit.   Results for orders placed or performed in visit on 11/25/23  POCT Urinalysis Dipstick   Collection Time: 11/25/23 11:21 AM  Result Value Ref Range   Color, UA yellow    Clarity, UA cloudy    Glucose, UA Negative Negative   Bilirubin, UA neg    Ketones, UA pos    Spec Grav, UA 1.020 1.010 - 1.025   Blood, UA pos    pH, UA 7.0 5.0 - 8.0   Protein, UA Negative Negative   Urobilinogen, UA 0.2 0.2 or 1.0 E.U./dL   Nitrite, UA pos    Leukocytes, UA Large (3+) (A)  Negative   Appearance cloudy    Odor normal        Assessment & Plan:   Problem List Items Addressed This Visit   None Visit Diagnoses       Burning with urination    -  Primary   Relevant Medications   sulfamethoxazole-trimethoprim (BACTRIM DS) 800-160 MG tablet   Other Relevant Orders   POCT Urinalysis Dipstick (Completed)   Urine Culture   Cervicovaginal ancillary only        Assessment and Plan Assessment & Plan Recurrent Urinary Tract Infections (UTIs) She experiences recurrent UTIs, with the current episode being the third in two months, characterized by dysuria and hematuria. She has had approximately twenty UTIs in her lifetime, with previous hospitalizations. A new sexual partner may be contributing factor. Her penicillin allergy limits antibiotic options. Bactrim is chosen as empirical treatment pending urine culture results to confirm the causative bacteria and ensure appropriate antibiotic selection. - Order urine culture to identify the specific bacteria causing the infection. - Prescribe Bactrim as an empirical antibiotic treatment. - Advise her to take acetaminophen / ibuprofen for pain management. - Recommend phenazopyridine for bladder spasms. - Encourage increased fluid intake. - Perform a vaginal swab to rule out other infections. -may need to consider urology referral  Penicillin Allergy She has a known allergy to penicillin, limiting antibiotic options for infections.  Follow-up Urine culture results are expected by Saturday, possibly late Friday. - Send prescription to [facility] on [address] in [place]. - Follow up with her regarding urine culture results to ensure the correct antibiotic is being used.        Follow up plan: Return if symptoms worsen or fail to improve.

## 2023-11-25 NOTE — Addendum Note (Signed)
 Addended by: Davene Costain on: 11/25/2023 11:27 AM   Modules accepted: Orders

## 2023-11-26 ENCOUNTER — Encounter: Payer: Self-pay | Admitting: Nurse Practitioner

## 2023-11-26 ENCOUNTER — Other Ambulatory Visit: Payer: Self-pay | Admitting: Nurse Practitioner

## 2023-11-26 DIAGNOSIS — B379 Candidiasis, unspecified: Secondary | ICD-10-CM

## 2023-11-26 LAB — CERVICOVAGINAL ANCILLARY ONLY
Bacterial Vaginitis (gardnerella): NEGATIVE
Candida Glabrata: NEGATIVE
Candida Vaginitis: POSITIVE — AB
Chlamydia: NEGATIVE
Comment: NEGATIVE
Comment: NEGATIVE
Comment: NEGATIVE
Comment: NEGATIVE
Comment: NEGATIVE
Comment: NORMAL
Neisseria Gonorrhea: NEGATIVE
Trichomonas: NEGATIVE

## 2023-11-26 MED ORDER — FLUCONAZOLE 150 MG PO TABS: 150.0000 mg | ORAL_TABLET | ORAL | 0 refills | Status: DC | PRN

## 2023-11-27 LAB — URINE CULTURE
MICRO NUMBER:: 16286226
SPECIMEN QUALITY:: ADEQUATE

## 2023-12-19 ENCOUNTER — Telehealth: Admitting: Physician Assistant

## 2023-12-19 DIAGNOSIS — R3989 Other symptoms and signs involving the genitourinary system: Secondary | ICD-10-CM | POA: Diagnosis not present

## 2023-12-19 MED ORDER — CEPHALEXIN 500 MG PO CAPS: 500.0000 mg | ORAL_CAPSULE | Freq: Two times a day (BID) | ORAL | 0 refills | Status: AC

## 2023-12-19 NOTE — Patient Instructions (Signed)
 Jamirra Bagtown, thank you for joining Marciana Settle, PA-C for today's virtual visit.  While this provider is not your primary care provider (PCP), if your PCP is located in our provider database this encounter information will be shared with them immediately following your visit.   A [facility] MyChart account gives you access to today's visit and all your visits, tests, and labs performed at [facility] " click here if you don't have a [facility] MyChart account or go to [email or web address]  Consent: (Patient) Emma Edwards provided verbal consent for this virtual visit at the beginning of the encounter.  Current Medications:  Current Outpatient Medications:    albuterol  (VENTOLIN  HFA) 108 (90 Base) MCG/ACT inhaler, Inhale 1-2 puffs into the lungs every 6 (six) hours as needed., Disp: 8 g, Rfl: 0   ALPRAZolam  (XANAX ) 0.25 MG tablet, Take 1 tablet (0.25 mg total) by mouth 2 (two) times daily as needed for anxiety. (Patient taking differently: Take 0.25 mg by mouth 2 (two) times daily as needed for anxiety.), Disp: 30 tablet, Rfl: 0   Ashwagandha 300 MG TABS, Take 350 mg by mouth daily as needed., Disp: , Rfl:    brompheniramine-pseudoephedrine-DM 30-2-10 MG/5ML syrup, Take 5 mLs by mouth 4 (four) times daily as needed., Disp: 120 mL, Rfl: 0   clotrimazole -betamethasone  (LOTRISONE ) cream, Apply 1 Application topically daily., Disp: 30 g, Rfl: 0   fluconazole  (DIFLUCAN ) 150 MG tablet, Take 1 tablet (150 mg total) by mouth every 3 (three) days as needed (for vaginal itching/yeast infection sx)., Disp: 2 tablet, Rfl: 0   fluticasone  (FLONASE ) 50 MCG/ACT nasal spray, Place 2 sprays into both nostrils daily., Disp: 16 g, Rfl: 0   hydrOXYzine  (VISTARIL ) 100 MG capsule, Take 100 mg by mouth 2 (two) times daily., Disp: , Rfl:    Lido-Menthol-Methyl Sal-Camph (CBD KINGS EX), Apply topically., Disp: , Rfl:    meloxicam  (MOBIC ) 15 MG tablet, Take 0.5-1 tablets (7.5-15 mg total)  by mouth daily., Disp: 90 tablet, Rfl: 1   nitrofurantoin , macrocrystal-monohydrate, (MACROBID ) 100 MG capsule, Take 1 capsule (100 mg total) by mouth 2 (two) times daily., Disp: 10 capsule, Rfl: 0   prazosin  (MINIPRESS ) 1 MG capsule, Take 1 capsule (1 mg total) by mouth at bedtime., Disp: 30 capsule, Rfl: 0   REXULTI 1 MG TABS tablet, Take 1 mg by mouth daily., Disp: , Rfl:    Medications ordered in this encounter:  No orders of the defined types were placed in this encounter.    *If you need refills on other medications prior to your next appointment, please contact your pharmacy*  Follow-Up: Call back or seek an in-person evaluation if the symptoms worsen or if the condition fails to improve as anticipated.  [facility] [phone]  Other Instructions Please report to the nearest Emergency room with any worsening symptoms. Follow up with primary care provider (PCP) in 2 -3 days.    If you have been instructed to have an in-person evaluation today at a local Urgent Care facility, please use the link below. It will take you to a list of all of our available [facility], including address, phone number and hours of operation. Please do not delay care.  [facility]  If you or a family member do not have a primary care provider, use the link below to schedule a visit and establish care. When you choose a  primary care physician or advanced practice provider, you gain  a long-term partner in health. Find a Primary Care Provider  Learn more about [facility] in-office and virtual care options:  - Get Care Now

## 2023-12-19 NOTE — Progress Notes (Signed)
 Virtual Visit Consent   Emma Edwards, you are scheduled for a virtual visit with a [facility] provider today. Just as with appointments in the office, your consent must be obtained to participate. Your consent will be active for this visit and any virtual visit you may have with one of our providers in the next 365 days. If you have a MyChart account, a copy of this consent can be sent to you electronically.  As this is a virtual visit, video technology does not allow for your provider to perform a traditional examination. This may limit your provider's ability to fully assess your condition. If your provider identifies any concerns that need to be evaluated in person or the need to arrange testing (such as labs, EKG, etc.), we will make arrangements to do so. Although advances in technology are sophisticated, we cannot ensure that it will always work on either your end or our end. If the connection with a video visit is poor, the visit may have to be switched to a telephone visit. With either a video or telephone visit, we are not always able to ensure that we have a secure connection.  By engaging in this virtual visit, you consent to the provision of healthcare and authorize for your insurance to be billed (if applicable) for the services provided during this visit. Depending on your insurance coverage, you may receive a charge related to this service.  I need to obtain your verbal consent now. Are you willing to proceed with your visit today? Emma Edwards has provided verbal consent on 12/19/2023 for a virtual visit (video or telephone). Marciana Settle, [place]  Date: 12/19/2023 11:20 AM   Virtual Visit via Video Note   I, Marciana Settle, connected with  Emma Edwards  ([id number], 12/25/1995) on 12/19/23 at 11:15 AM EDT by a video-enabled telemedicine application and verified that I am speaking with the correct person using two identifiers.  Location: Patient: Virtual Visit Location  Patient: Home Provider: Virtual Visit Location Provider: Home Office   I discussed the limitations of evaluation and management by telemedicine and the availability of in person appointments. The patient expressed understanding and agreed to proceed.    History of Present Illness: Emma Edwards is a 28 y.o. who identifies as a female who was assigned female at birth, and is being seen today for UTI symptoms.  HPI: Urinary Tract Infection  This is a new problem. The current episode started yesterday. The problem occurs every urination. The problem has been unchanged. The quality of the pain is described as aching. The patient is experiencing no pain. There has been no fever. She is Sexually active. There is No history of pyelonephritis. Associated symptoms include frequency and urgency. Pertinent negatives include no discharge, flank pain, sweats or vomiting. She has tried nothing for the symptoms. The treatment provided no relief. Her past medical history is significant for recurrent UTIs.    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  Surveillance for birth control, oral contraceptives 02/07/2015   History of kidney stones 02/07/2015   History of migraine headaches 02/07/2015   Menorrhagia with regular cycle 02/07/2015   Mood changes 02/07/2015    Allergies:  Allergies  Allergen Reactions   Amoxicillin Hives   Penicillins  Hives   Medications:  Current Outpatient Medications:    albuterol  (VENTOLIN  HFA) 108 (90 Base) MCG/ACT inhaler, Inhale 1-2 puffs into the lungs every 6 (six) hours as needed., Disp: 8 g, Rfl: 0   ALPRAZolam  (XANAX ) 0.25 MG tablet, Take 1 tablet (0.25 mg total) by mouth 2 (two) times daily as needed for anxiety. (Patient taking differently: Take 0.25 mg by mouth 2 (two) times daily as needed for anxiety.), Disp: 30 tablet, Rfl: 0   Ashwagandha 300 MG TABS, Take 350 mg by mouth daily as needed., Disp: , Rfl:    brompheniramine-pseudoephedrine-DM 30-2-10 MG/5ML syrup, Take 5 mLs by mouth 4 (four) times daily as needed., Disp: 120 mL, Rfl: 0   clotrimazole -betamethasone  (LOTRISONE ) cream, Apply 1 Application topically daily., Disp: 30 g, Rfl: 0   fluconazole  (DIFLUCAN ) 150 MG tablet, Take 1 tablet (150 mg total) by mouth every 3 (three) days as needed (for vaginal itching/yeast infection sx)., Disp: 2 tablet, Rfl: 0   fluticasone  (FLONASE ) 50 MCG/ACT nasal spray, Place 2 sprays into both nostrils daily., Disp: 16 g, Rfl: 0   hydrOXYzine  (VISTARIL ) 100 MG capsule, Take 100 mg by mouth 2 (two) times daily., Disp: , Rfl:    Lido-Menthol-Methyl Sal-Camph (CBD KINGS EX), Apply topically., Disp: , Rfl:    meloxicam  (MOBIC ) 15 MG tablet, Take 0.5-1 tablets (7.5-15 mg total) by mouth daily., Disp: 90 tablet, Rfl: 1   nitrofurantoin , macrocrystal-monohydrate, (MACROBID ) 100 MG capsule, Take 1 capsule (100 mg total) by mouth 2 (two) times daily., Disp: 10 capsule, Rfl: 0   prazosin  (MINIPRESS ) 1 MG capsule, Take 1 capsule (1 mg total) by mouth at bedtime., Disp: 30 capsule, Rfl: 0   REXULTI 1 MG TABS tablet, Take 1 mg by mouth daily., Disp: , Rfl:   Observations/Objective: Patient is well-developed, well-nourished in no acute distress.  Resting comfortably  at home.  Head is normocephalic, atraumatic.  No labored breathing.  Speech is clear and coherent with logical content.  Patient is alert and oriented  at baseline.    Assessment and Plan: 1. Suspected UTI (Primary)  Patient presenting with dysuria most consistent with UTI. I also considered PID, pregnancy, ectopic pregnancy, endometriosis, tubovarian abscess, appendicitis,   and pyelonephritis, but this appears less likely considering the data gathered thus far. Antibiotic prescribed.  I have instructed the patient to present to the nearest ER at any time if there are any new or worsening symptoms.  The patient expressed understanding of and agreement with this plan.  Opportunity was given for questions prior to discharge and all stated questions were answered to the patient's satisfaction.    Follow Up Instructions: I discussed the assessment and treatment plan with the patient. The patient was provided an opportunity to ask questions and all were answered. The patient agreed with the plan and demonstrated an understanding of the instructions.  A copy of instructions were sent to the patient via MyChart unless otherwise noted below.    The patient was advised to call back or seek an in-person evaluation if the symptoms worsen or if the condition fails to improve as anticipated.    Marciana Settle, PA-C

## 2023-12-21 ENCOUNTER — Ambulatory Visit (INDEPENDENT_AMBULATORY_CARE_PROVIDER_SITE_OTHER): Admitting: Family Medicine

## 2023-12-21 ENCOUNTER — Encounter: Payer: Self-pay | Admitting: Family Medicine

## 2023-12-21 ENCOUNTER — Ambulatory Visit: Payer: Self-pay

## 2023-12-21 ENCOUNTER — Other Ambulatory Visit (HOSPITAL_COMMUNITY)
Admission: RE | Admit: 2023-12-21 | Discharge: 2023-12-21 | Disposition: A | Discharge status: Home / Self Care | Source: Ambulatory Visit | Attending: Family Medicine | Admitting: Family Medicine

## 2023-12-21 VITALS — BP 124/72 | HR 91 | Resp 16 | Ht 64.0 in | Wt 163.0 lb

## 2023-12-21 DIAGNOSIS — R899 Unspecified abnormal finding in specimens from other organs, systems and tissues: Secondary | ICD-10-CM

## 2023-12-21 DIAGNOSIS — N390 Urinary tract infection, site not specified: Secondary | ICD-10-CM | POA: Diagnosis not present

## 2023-12-21 DIAGNOSIS — Z113 Encounter for screening for infections with a predominantly sexual mode of transmission: Secondary | ICD-10-CM | POA: Insufficient documentation

## 2023-12-21 DIAGNOSIS — B379 Candidiasis, unspecified: Secondary | ICD-10-CM

## 2023-12-21 DIAGNOSIS — T3695XA Adverse effect of unspecified systemic antibiotic, initial encounter: Secondary | ICD-10-CM

## 2023-12-21 DIAGNOSIS — R790 Abnormal level of blood mineral: Secondary | ICD-10-CM

## 2023-12-21 MED ORDER — FLUCONAZOLE 150 MG PO TABS: 150.0000 mg | ORAL_TABLET | ORAL | 0 refills | Status: DC | PRN

## 2023-12-21 NOTE — Progress Notes (Signed)
 Patient ID: Emma Edwards, female    DOB: 1995/12/21, 28 y.o.   MRN: [id number]  PCP: Adeline Hone, PA-C  Chief Complaint  Patient presents with   Vaginal Itching    Started after starting antibiotics for UTI    Subjective:   Emma Edwards is a 28 y.o. female, presents to clinic with CC of the following:  HPI  Here with yeast infection sx Recent culture + UTI and yeast infection (on cytology) tx with macrobid  and diflucan   Recent other abx started - keflex  2 d ago for suspected UTI/virtual visit, yeast infection sx returned - was told she needs PCP to do yeast infection tx Urinary sx are already feeling better  Asks for STD screening tests  Last testing 5-6 months ago, sexually active in the recent months w/o condoms, 3 female partners  Also she never completed prior labs ordered for iron abnormalities Patient Active Problem List   Diagnosis Date Noted   Moderate episode of recurrent major depressive disorder (HCC) 07/14/2023   Insomnia due to other mental disorder 07/14/2023   Controlled substance agreement signed 07/14/2023   Anxiety 06/29/2023   Unspecified mood (affective) disorder (HCC) 06/17/2023   Screen for STD (sexually transmitted disease) 06/24/2016   Vaginal discharge 06/24/2016   Labial lesion 06/24/2016   Sebaceous cyst 09/09/2015   Influenza vaccination declined by patient 05/10/2015   Threatened abortion in first trimester 02/13/2015   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 (Patient taking differently: Take 0.25 mg by mouth 2 (two) times daily as needed for anxiety.), Disp: 30 tablet, Rfl: 0   Ashwagandha 300 MG TABS, Take 350 mg by mouth daily as needed., Disp: , Rfl:    brompheniramine-pseudoephedrine-DM 30-2-10 MG/5ML syrup, Take 5 mLs by mouth 4 (four) times daily as needed., Disp: 120 mL, Rfl: 0   cephALEXin  (KEFLEX ) 500 MG capsule, Take 1 capsule (500 mg total) by mouth 2 (two) times daily for 7 days., Disp: 14 capsule, Rfl: 0   clotrimazole -betamethasone  (LOTRISONE ) cream, Apply 1 Application topically daily., Disp: 30 g, Rfl: 0   fluconazole  (DIFLUCAN ) 150 MG tablet, Take 1 tablet (150 mg total) by mouth every 3 (three) days as needed (for vaginal itching/yeast infection sx)., Disp: 4 tablet, Rfl: 0   fluticasone  (FLONASE ) 50 MCG/ACT nasal spray, Place 2 sprays into both nostrils daily., Disp: 16 g, Rfl: 0   hydrOXYzine  (VISTARIL ) 100 MG capsule, Take 100 mg by mouth 2 (two) times daily., Disp: , Rfl:    Lido-Menthol-Methyl Sal-Camph (CBD KINGS EX), Apply topically., Disp: , Rfl:    meloxicam  (MOBIC ) 15 MG tablet, Take 0.5-1 tablets (7.5-15 mg total) by mouth daily., Disp: 90 tablet, Rfl: 1   nitrofurantoin , macrocrystal-monohydrate, (MACROBID ) 100 MG capsule, Take 1 capsule (100 mg total) by mouth 2 (two) times daily., Disp: 10 capsule, Rfl: 0   prazosin  (MINIPRESS ) 1 MG capsule, Take 1 capsule (1 mg total) by mouth at bedtime., Disp: 30 capsule, Rfl: 0  REXULTI 1 MG TABS tablet, Take 1 mg by mouth daily., Disp: , Rfl:    Allergies  Allergen Reactions   Amoxicillin Hives   Penicillins Hives     Social History   Tobacco Use   Smoking status: Former    Current packs/day: 0.00    Average packs/day: 1 pack/day for 2.0 years (2.0 ttl pk-yrs)    Types: Cigarettes    Start date: 02/21/2014    Quit date: 02/22/2016    Years since quitting: 7.8   Smokeless tobacco: Never  Vaping Use   Vaping status: Never Used   Substance Use Topics   Alcohol use: No    Alcohol/week: 0.0 standard drinks of alcohol   Drug use: No      Chart Review Today: I personally reviewed active problem list, medication list, allergies, family history, social history, health maintenance, notes from last encounter, lab results, imaging with the patient/caregiver today.    Review of Systems  Constitutional: Negative.   HENT: Negative.    Eyes: Negative.   Respiratory: Negative.    Cardiovascular: Negative.   Gastrointestinal: Negative.   Endocrine: Negative.   Genitourinary: Negative.   Musculoskeletal: Negative.   Skin: Negative.   Allergic/Immunologic: Negative.   Neurological: Negative.   Hematological: Negative.   Psychiatric/Behavioral: Negative.    All other systems reviewed and are negative.      Objective:   Vitals:   12/21/23 1300  BP: 124/72  Pulse: 91  Resp: 16  SpO2: 97%  Weight: 163 lb (73.9 kg)  Height: 5\' 4"  (1.626 m)    Body mass index is 27.98 kg/m.  Physical Exam Vitals and nursing note reviewed.  Constitutional:      General: She is not in acute distress.    Appearance: Normal appearance. She is well-developed. She is not ill-appearing, toxic-appearing or diaphoretic.  HENT:     Head: Normocephalic and atraumatic.     Nose: Nose normal.  Eyes:     General:        Right eye: No discharge.        Left eye: No discharge.     Conjunctiva/sclera: Conjunctivae normal.  Neck:     Trachea: No tracheal deviation.  Cardiovascular:     Rate and Rhythm: Normal rate.     Pulses: Normal pulses.  Pulmonary:     Effort: Pulmonary effort is normal. No respiratory distress.     Breath sounds: No stridor.  Abdominal:     General: Bowel sounds are normal. There is no distension.     Palpations: Abdomen is soft.     Tenderness: There is no abdominal tenderness. There is no right CVA tenderness, left CVA tenderness or guarding.  Musculoskeletal:        General: Normal range of motion.   Skin:    General: Skin is warm and dry.     Coloration: Skin is not jaundiced.     Findings: No rash.  Neurological:     Mental Status: She is alert.     Motor: No abnormal muscle tone.     Coordination: Coordination normal.  Psychiatric:        Behavior: Behavior normal.      Results for orders placed or performed in visit on 11/25/23  Cervicovaginal ancillary only   Collection Time: 11/25/23 11:15 AM  Result Value Ref Range   Neisseria Gonorrhea Negative    Chlamydia Negative    Trichomonas Negative    Bacterial Vaginitis (gardnerella) Negative  Candida Vaginitis Positive (A)    Candida Glabrata Negative    Comment      Normal Reference Range Bacterial Vaginosis - Negative   Comment Normal Reference Range Candida Species - Negative    Comment Normal Reference Range Candida Galbrata - Negative    Comment Normal Reference Range Trichomonas - Negative    Comment Normal Reference Ranger Chlamydia - Negative    Comment      Normal Reference Range Neisseria Gonorrhea - Negative  POCT Urinalysis Dipstick   Collection Time: 11/25/23 11:21 AM  Result Value Ref Range   Color, UA yellow    Clarity, UA cloudy    Glucose, UA Negative Negative   Bilirubin, UA neg    Ketones, UA pos    Spec Grav, UA 1.020 1.010 - 1.025   Blood, UA pos    pH, UA 7.0 5.0 - 8.0   Protein, UA Negative Negative   Urobilinogen, UA 0.2 0.2 or 1.0 E.U./dL   Nitrite, UA pos    Leukocytes, UA Large (3+) (A) Negative   Appearance cloudy    Odor normal   Urine Culture   Collection Time: 11/25/23 12:01 PM   Specimen: Urine  Result Value Ref Range   MICRO NUMBER: [id number]    SPECIMEN QUALITY: Adequate    Sample Source URINE    STATUS: FINAL    ISOLATE 1: Streptococcus agalactiae (A)        Assessment & Plan:   1. Recurrent UTI (Primary) Feeling better on abx, offered culture in office due to multiple UTIs recently - Urine Culture  2. Antibiotic-induced yeast infection Swab done, tx with  diflucan , extra doses ordered - fluconazole  (DIFLUCAN ) 150 MG tablet; Take 1 tablet (150 mg total) by mouth every 3 (three) days as needed (for vaginal itching/yeast infection sx).  Dispense: 4 tablet; Refill: 0  3. Screen for STD (sexually transmitted disease) Multiple partners and without condoms in the last 6 months, at higher risk, screening done today, pt will be notified of any + results and needed tx - Cervicovaginal ancillary only - HIV Antibody (routine testing w rflx) - RPR  4. Abnormal iron saturation Lab received from outside practice showed iron high and no CBC, not on iron supplements Recheck labs - CBC with Differential/Platelet - Iron, TIBC and Ferritin Panel  5. Abnormal laboratory test  - CBC with Differential/Platelet - Iron, TIBC and Ferritin Panel   f/up determined by lab results   Adeline Hone, PA-C 12/21/23 5:16 PM

## 2023-12-21 NOTE — Telephone Encounter (Signed)
 Copied From CRM [id number]. Reason for Triage: Patient is calling to get appointment for yeast infection, she is stating that she has discharge, pain and itching. She is stating that the discharge is white and a lot more than other.  Chief Complaint: Yeast infection Symptoms: White discharge, itching, burning Frequency: Yesterday, UTI since this weekend Pertinent Negatives: Patient denies fever Disposition: [] ED /[] Urgent Care (no appt availability in office) / [x] Appointment(In office/virtual)/ []  [facility]/ [] Home Care/ [] Refused Recommended Disposition /[] [facility]/ []  Follow-up with PCP Additional Notes: Patient called in to report symptoms of a yeast infection. Symptoms include white discharge, itching and burning. Symptoms have been present since yesterday. Patient stated she was also treated for a UTI over the weekend. Patient is still experiencing discomfort while urinating. Advised patient to be seen within 3 days, per protocol. Scheduled same day appointment with PCP. Provided care advice and instructed patient to call back if symptoms worsen. Patient complied.   Reason for Disposition  [1] Symptoms of a "yeast infection" (i.e., itchy, white discharge, not bad smelling) AND [2] not improved > 3 days following Care Advice    Patient has also been dealing with UTI symptoms since the weekend.  Answer Assessment - Initial Assessment Questions 1. DISCHARGE: "Describe the discharge." (e.g., white, yellow, green, gray, foamy, cottage cheese-like)     White 2. ODOR: "Is there a bad odor?"     Denies 3. ONSET: "When did the discharge begin?"     Yesterday 4. RASH: "Is there a rash in the genital area?" If Yes, ask: "Describe it." (e.g., redness, blisters, sores, bumps)     Denies 5. ABDOMEN PAIN: "Are you having any abdomen pain?" If Yes, ask: "What does it feel like? " (e.g., crampy, dull, intermittent, constant)      Denies 7. CAUSE: "What do you think is causing  the discharge?" "Have you had the same problem before? What happened then?"     Yeast infection, currently has UTI 8. OTHER SYMPTOMS: "Do you have any other symptoms?" (e.g., fever, itching, vaginal bleeding, pain with urination, injury to genital area, vaginal foreign body)     Itching and burning, denies fever  Protocols used: Vaginal Discharge-A-AH

## 2023-12-22 LAB — CBC WITH DIFFERENTIAL/PLATELET
Absolute Lymphocytes: 2415 {cells}/uL (ref 850–3900)
Absolute Monocytes: 483 {cells}/uL (ref 200–950)
Basophils Absolute: 41 {cells}/uL (ref 0–200)
Basophils Relative: 0.6 %
Eosinophils Absolute: 62 {cells}/uL (ref 15–500)
Eosinophils Relative: 0.9 %
HCT: 41.4 % (ref 35.0–45.0)
Hemoglobin: 13.9 g/dL (ref 11.7–15.5)
MCH: 31.3 pg (ref 27.0–33.0)
MCHC: 33.6 g/dL (ref 32.0–36.0)
MCV: 93.2 fL (ref 80.0–100.0)
MPV: 14 fL — ABNORMAL HIGH (ref 7.5–12.5)
Monocytes Relative: 7 %
Neutro Abs: 3899 {cells}/uL (ref 1500–7800)
Neutrophils Relative %: 56.5 %
Platelets: 184 10*3/uL (ref 140–400)
RBC: 4.44 10*6/uL (ref 3.80–5.10)
RDW: 12.6 % (ref 11.0–15.0)
Total Lymphocyte: 35 %
WBC: 6.9 10*3/uL (ref 3.8–10.8)

## 2023-12-22 LAB — URINE CULTURE
MICRO NUMBER:: 16389907
Result:: NO GROWTH
SPECIMEN QUALITY:: ADEQUATE

## 2023-12-22 LAB — HIV ANTIBODY (ROUTINE TESTING W REFLEX): HIV 1&2 Ab, 4th Generation: NONREACTIVE

## 2023-12-22 LAB — IRON,TIBC AND FERRITIN PANEL
%SAT: 38 % (ref 16–45)
Ferritin: 11 ng/mL — ABNORMAL LOW (ref 16–154)
Iron: 96 ug/dL (ref 40–190)
TIBC: 251 ug/dL (ref 250–450)

## 2023-12-22 LAB — RPR: RPR Ser Ql: NONREACTIVE

## 2023-12-23 ENCOUNTER — Encounter: Payer: Self-pay | Admitting: Family Medicine

## 2023-12-23 DIAGNOSIS — N760 Acute vaginitis: Secondary | ICD-10-CM

## 2023-12-23 LAB — CERVICOVAGINAL ANCILLARY ONLY
Bacterial Vaginitis (gardnerella): POSITIVE — AB
Candida Glabrata: NEGATIVE
Candida Vaginitis: POSITIVE — AB
Chlamydia: NEGATIVE
Comment: NEGATIVE
Comment: NEGATIVE
Comment: NEGATIVE
Comment: NEGATIVE
Comment: NEGATIVE
Comment: NORMAL
Neisseria Gonorrhea: NEGATIVE
Trichomonas: NEGATIVE

## 2023-12-28 MED ORDER — METRONIDAZOLE 0.75 % VA GEL: 1.0000 | Freq: Every day | VAGINAL | 0 refills | Status: AC

## 2024-02-16 ENCOUNTER — Ambulatory Visit: Admitting: Physician Assistant

## 2024-02-16 ENCOUNTER — Telehealth: Payer: Self-pay

## 2024-02-16 NOTE — Telephone Encounter (Signed)
 [facility] is not taking new patients. VM not set up to notify patient

## 2024-02-16 NOTE — Telephone Encounter (Signed)
 Copied from CRM [id number]. Topic: Referral - Status >> Feb 16, 2024  9:34 AM Rosaria BRAVO wrote: Reason for CRM: Pt wants to have her referral for dermatology sent to [place] because [place]    Best contact: [phone]

## 2024-04-07 NOTE — Progress Notes (Deleted)
 Office Visit Note  Patient: Emma Edwards             Date of Birth: 03-Feb-1996           MRN: [id number]             PCP: Leavy Mole, PA-C Referring: Leavy Mole, PA-C Visit Date: 04/21/2024 Occupation: @GUAROCC @  Subjective:  No chief complaint on file.   History of Present Illness: [name] is a 28 y.o. female ***     Activities of Daily Living:  Patient reports morning stiffness for *** {minute/hour:19697}.   Patient {ACTIONS;DENIES/REPORTS:21021675::Denies} nocturnal pain.  Difficulty dressing/grooming: {ACTIONS;DENIES/REPORTS:21021675::Denies} Difficulty climbing stairs: {ACTIONS;DENIES/REPORTS:21021675::Denies} Difficulty getting out of chair: {ACTIONS;DENIES/REPORTS:21021675::Denies} Difficulty using hands for taps, buttons, cutlery, and/or writing: {ACTIONS;DENIES/REPORTS:21021675::Denies}  No Rheumatology ROS completed.   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   Surveillance for birth control, oral contraceptives 02/07/2015   History of kidney stones 02/07/2015   History of migraine headaches 02/07/2015   Menorrhagia with regular cycle 02/07/2015   Mood changes 02/07/2015    Past Medical History:  Diagnosis Date   Allergy    Anxiety    Headache(784.0) 2013   Pilonidal cyst 2014   Unspecified mood (affective) disorder (HCC) 06/17/2023    Family History  Problem  Relation Age of Onset   Alcohol abuse Mother    Depression Mother    Drug abuse Mother    Mental illness Mother    Alcohol abuse Father    Arthritis Father    Depression Father    Drug abuse Father    Hypertension Father    Hypertension Paternal Uncle    Alcohol abuse Maternal Grandmother    Depression Maternal Grandmother    Drug abuse Maternal Grandmother    Mental illness Maternal Grandmother    Alcohol abuse Maternal Grandfather    Drug abuse Maternal Grandfather    Arthritis Paternal Grandmother    Asthma Paternal Grandmother    Cancer Paternal Grandmother    COPD Paternal Grandmother    Hyperlipidemia Paternal Grandmother    No past surgical history on file. Social History   Social History Narrative   Not on file   Immunization History  Administered Date(s) Administered   DTP / HiB 07/07/1995, 09/07/1995, 11/04/1995   DTaP 05/18/1997, 02/19/2000, 05/25/2014   HIB, Unspecified 05/18/1997   HPV Quadrivalent 03/30/2011, 06/09/2011, 11/17/2011   Hep B, Unspecified 17-Jun-1995, 07/07/1995, 02/02/1996   IPV 02/19/2000   Influenza,Quad,Nasal, Live 05/08/2013   MMR 05/11/1996, 02/19/2000   Meningococcal Conjugate 11/17/2011   OPV 07/07/1995, 09/07/1995, 11/04/1995   PFIZER(Purple Top)SARS-COV-2 Vaccination 05/05/2020   Tdap 11/17/2011     Objective: Vital Signs: There were no vitals taken for this visit.   Physical Exam   Musculoskeletal Exam: ***  CDAI Exam: CDAI Score: -- Patient Global: --; Provider Global: -- Swollen: --; Tender: -- Joint Exam 04/21/2024   No joint exam has been documented for this visit   There is currently no information  documented on the homunculus. Go to the Rheumatology activity and complete the homunculus joint exam.  Investigation: No additional findings.  Imaging: No results found.  Recent Labs: Lab Results  Component Value Date   WBC 6.9 12/21/2023   HGB 13.9 12/21/2023   PLT 184 12/21/2023   NA 141 10/26/2023   K  4.4 10/26/2023   CL 103 10/26/2023   CO2 25 (A) 10/26/2023   GLUCOSE 89 06/14/2023   BUN 18 10/26/2023   CREATININE 0.8 10/26/2023   BILITOT 0.4 06/14/2023   ALKPHOS 85 10/26/2023   AST 9 (A) 10/26/2023   ALT 13 10/26/2023   PROT 6.9 06/14/2023   ALBUMIN 4.4 10/26/2023   CALCIUM 9.3 10/26/2023    Speciality Comments: No specialty comments available.  Procedures:  No procedures performed Allergies: Amoxicillin and Penicillins   Assessment / Plan:     Visit Diagnoses: Polyarthralgia  Psoriasis  Psoriasiform dermatitis  Moderate episode of recurrent major depressive disorder (HCC)  Insomnia due to other mental disorder  History of migraine headaches  History of kidney stones  Anxiety  Sebaceous cyst  Orders: No orders of the defined types were placed in this encounter.  No orders of the defined types were placed in this encounter.   Face-to-face time spent with patient was *** minutes. Greater than 50% of time was spent in counseling and coordination of care.  Follow-Up Instructions: No follow-ups on file.   Waddell CHRISTELLA Craze, PA-C  Note - This record has been created using Dragon software.  Chart creation errors have been sought, but may not always  have been located. Such creation errors do not reflect on  the standard of medical care.

## 2024-04-19 ENCOUNTER — Ambulatory Visit (INDEPENDENT_AMBULATORY_CARE_PROVIDER_SITE_OTHER): Admitting: Family Medicine

## 2024-04-19 VITALS — BP 118/72 | HR 92 | Resp 16 | Ht 64.0 in | Wt 179.0 lb

## 2024-04-19 DIAGNOSIS — Z3202 Encounter for pregnancy test, result negative: Secondary | ICD-10-CM | POA: Diagnosis not present

## 2024-04-19 LAB — POCT URINE PREGNANCY: Preg Test, Ur: NEGATIVE

## 2024-04-19 NOTE — Progress Notes (Signed)
 Patient ID: Emma Edwards, female    DOB: November 11, 1995, 29 y.o.   MRN: [id number]  PCP: Leavy Mole, PA-C  Chief Complaint  Patient presents with   Possible Pregnancy    No sx, but took home test for fun. Had 2 home tests w/faint lines. Just started bleeding when giving urine sample.    Subjective:   Emma Edwards is a 28 y.o. female, presents to clinic with CC of the following:  HPI  Here for preg test due to possible positive home urine testing Sexually active w/o pregnancy prevention methods, not trying but not trying to prevent either Expected period today No cramping Started bleeding in office bathroom Urine test here neg    Patient Active Problem List   Diagnosis Date Noted   Moderate episode of recurrent major depressive disorder (HCC) 07/14/2023   Insomnia due to other mental disorder 07/14/2023   Controlled substance agreement signed 07/14/2023   Anxiety 06/29/2023   Unspecified mood (affective) disorder (HCC) 06/17/2023   Screen for STD (sexually transmitted disease) 06/24/2016   Vaginal discharge 06/24/2016   Labial lesion 06/24/2016   Sebaceous cyst 09/09/2015   Influenza vaccination declined by patient 05/10/2015   Threatened abortion in first trimester 02/13/2015   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   Surveillance for birth control, oral contraceptives 02/07/2015   History of kidney stones 02/07/2015   History of migraine headaches 02/07/2015   Menorrhagia with regular cycle 02/07/2015   Mood changes 02/07/2015      Current Outpatient Medications:    albuterol  (VENTOLIN  HFA) 108 (90 Base) MCG/ACT inhaler, Inhale 1-2 puffs into the lungs every 6 (six) hours as needed., Disp: 8 g, Rfl: 0   hydrOXYzine  (VISTARIL ) 100 MG capsule, Take 100 mg by mouth 2 (two) times daily., Disp: , Rfl:    Lido-Menthol-Methyl Sal-Camph (CBD KINGS EX), Apply topically., Disp: , Rfl:    meloxicam  (MOBIC ) 15 MG tablet, Take 0.5-1 tablets (7.5-15 mg total) by  mouth daily., Disp: 90 tablet, Rfl: 1   REXULTI 1 MG TABS tablet, Take 1 mg by mouth daily., Disp: , Rfl:    ALPRAZolam  (XANAX ) 0.25 MG tablet, Take 1 tablet (0.25 mg total) by mouth 2 (two) times daily as needed for anxiety. (Patient not taking: Reported on 04/19/2024), Disp: 30 tablet, Rfl: 0   Allergies  Allergen Reactions   Amoxicillin Hives   Penicillins Hives     Social History   Tobacco Use   Smoking status: Former    Current packs/day: 0.00    Average packs/day: 1 pack/day for 2.0 years (2.0 ttl pk-yrs)    Types: Cigarettes    Start date: 02/21/2014    Quit date: 02/22/2016    Years since quitting: 8.1   Smokeless tobacco: Never  Vaping Use   Vaping status: Never Used  Substance Use Topics   Alcohol use: No    Alcohol/week: 0.0 standard drinks of alcohol   Drug use: No      Chart Review Today: I personally reviewed active problem list, medication list, allergies, family history, social history, health maintenance, notes from last encounter, lab results, imaging with the patient/caregiver today.   Review of Systems  Constitutional: Negative.   HENT: Negative.    Eyes: Negative.   Respiratory: Negative.    Cardiovascular: Negative.   Gastrointestinal: Negative.   Endocrine: Negative.   Genitourinary: Negative.   Musculoskeletal: Negative.   Skin: Negative.   Allergic/Immunologic: Negative.   Neurological: Negative.   Hematological: Negative.   Psychiatric/Behavioral:  Negative.    All other systems reviewed and are negative.      Objective:   Vitals:   04/19/24 1256  BP: 118/72  Pulse: 92  Resp: 16  SpO2: 97%  Weight: 179 lb (81.2 kg)  Height: 5' 4 (1.626 m)    Body mass index is 30.73 kg/m.  Physical Exam Vitals and nursing note reviewed.  Constitutional:      General: She is not in acute distress.    Appearance: Normal appearance. She is well-developed. She is not ill-appearing, toxic-appearing or diaphoretic.  HENT:     Head:  Normocephalic and atraumatic.     Right Ear: External ear normal.     Left Ear: External ear normal.     Nose: Nose normal.  Eyes:     General: No scleral icterus.       Right eye: No discharge.        Left eye: No discharge.     Conjunctiva/sclera: Conjunctivae normal.  Neck:     Trachea: No tracheal deviation.  Cardiovascular:     Rate and Rhythm: Normal rate.  Pulmonary:     Effort: Pulmonary effort is normal. No respiratory distress.     Breath sounds: No stridor.  Skin:    General: Skin is warm and dry.     Findings: No rash.  Neurological:     Mental Status: She is alert.     Motor: No abnormal muscle tone.     Coordination: Coordination normal.     Gait: Gait normal.  Psychiatric:        Mood and Affect: Mood normal.        Behavior: Behavior normal.      Results for orders placed or performed in visit on 04/19/24  POCT urine pregnancy   Collection Time: 04/19/24  1:04 PM  Result Value Ref Range   Preg Test, Ur Negative Negative       Assessment & Plan:   1. Negative pregnancy test (Primary)  - POCT urine pregnancy - hCG, Total, Quantitative; Standing Presented for pregnancy test - home tests possibly positive with faint line, menses due today and started today with POC preg neg today  Plan - if menses not her normal or any other sx/concerns in next 1-2 weeks pt will send mychart msg and we today discussed f/up labs which could be don e- repeat POC preg vs quantitative HCG x 2 blood testings    Michelene Cower, PA-C 04/19/24 1:12 PM

## 2024-04-21 ENCOUNTER — Encounter: Admitting: Rheumatology

## 2024-04-21 DIAGNOSIS — L409 Psoriasis, unspecified: Secondary | ICD-10-CM

## 2024-04-21 DIAGNOSIS — L723 Sebaceous cyst: Secondary | ICD-10-CM

## 2024-04-21 DIAGNOSIS — L308 Other specified dermatitis: Secondary | ICD-10-CM

## 2024-04-21 DIAGNOSIS — Z87442 Personal history of urinary calculi: Secondary | ICD-10-CM

## 2024-04-21 DIAGNOSIS — Z8669 Personal history of other diseases of the nervous system and sense organs: Secondary | ICD-10-CM

## 2024-04-21 DIAGNOSIS — M2550 Pain in unspecified joint: Secondary | ICD-10-CM

## 2024-04-21 DIAGNOSIS — F331 Major depressive disorder, recurrent, moderate: Secondary | ICD-10-CM

## 2024-04-21 DIAGNOSIS — F419 Anxiety disorder, unspecified: Secondary | ICD-10-CM

## 2024-04-21 DIAGNOSIS — F5105 Insomnia due to other mental disorder: Secondary | ICD-10-CM

## 2024-05-04 ENCOUNTER — Encounter: Payer: Self-pay | Admitting: Emergency Medicine

## 2024-05-04 ENCOUNTER — Ambulatory Visit
Admission: EM | Admit: 2024-05-04 | Discharge: 2024-05-04 | Disposition: A | Discharge status: Home / Self Care | Attending: Emergency Medicine | Admitting: Emergency Medicine

## 2024-05-04 DIAGNOSIS — J069 Acute upper respiratory infection, unspecified: Secondary | ICD-10-CM | POA: Insufficient documentation

## 2024-05-04 LAB — SARS CORONAVIRUS 2 BY RT PCR: SARS Coronavirus 2 by RT PCR: NEGATIVE

## 2024-05-04 MED ORDER — PROMETHAZINE-DM 6.25-15 MG/5ML PO SYRP: 5.0000 mL | ORAL_SOLUTION | Freq: Four times a day (QID) | ORAL | 0 refills | Status: DC | PRN

## 2024-05-04 MED ORDER — ALBUTEROL SULFATE HFA 108 (90 BASE) MCG/ACT IN AERS: 2.0000 | INHALATION_SPRAY | RESPIRATORY_TRACT | 0 refills | Status: AC | PRN

## 2024-05-04 MED ORDER — IPRATROPIUM BROMIDE 0.06 % NA SOLN: 2.0000 | Freq: Four times a day (QID) | NASAL | 12 refills | Status: AC

## 2024-05-04 MED ORDER — BENZONATATE 100 MG PO CAPS: 200.0000 mg | ORAL_CAPSULE | Freq: Three times a day (TID) | ORAL | 0 refills | Status: DC

## 2024-05-04 MED ORDER — AEROCHAMBER MV MISC: 2 refills | Status: AC

## 2024-05-04 NOTE — ED Triage Notes (Signed)
 Pt c/o chest congestion, chest burning, upset stomach, temperature of 98 x4days  Pt states that she is around family and they are sick at home.

## 2024-05-04 NOTE — Discharge Instructions (Addendum)
 Your testing today for COVID was negative.  Your exam is consistent with an upper respiratory infection and it is most likely viral given your short duration of symptoms.  Use over-the-counter Tylenol  and/or ibuprofen according the package instructions as needed for any fever or pain.  You may use the albuterol  inhaler, with the spacer, and take 1 to 2 puffs every 4-6 hours as needed for any shortness of breath or if you develop wheezing.  This should also help you with your chest tightness.Use the Atrovent  nasal spray, 2 squirts in each nostril every 6 hours, as needed for runny nose and postnasal drip.  Use the Tessalon  Perles every 8 hours during the day.  Take them with a small sip of water.  They may give you some numbness to the base of your tongue or a metallic taste in your mouth, this is normal.  Use the Promethazine  DM cough syrup at bedtime for cough and congestion.  It will make you drowsy so do not take it during the day.  Return for reevaluation or see your primary care provider for any new or worsening symptoms.

## 2024-05-04 NOTE — ED Provider Notes (Signed)
 [facility]    CSN: [id number] Arrival date & time: 05/04/24  0901      History   Chief Complaint No chief complaint on file.   HPI Emma Edwards is a 28 y.o. female.   HPI  28 year old female with past medical history significant for migraine headaches, anxiety, and moderate recurrent MDD presents for evaluation of respiratory symptoms that began 4 days ago.  The patient reports that she had a temperature of 98 on the first day of symptoms which she is terming a fever with associated runny nose, nasal congestion, cough that is intermittently productive for yellow sputum, and shortness of breath.  She denies any fever or wheezing.  Other family members are sick with similar symptoms but not have been to the doctor to be tested.  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   Controlled substance agreement signed 07/14/2023   Anxiety 06/29/2023   Unspecified mood (affective) disorder (HCC) 06/17/2023   Screen for STD (sexually transmitted disease) 06/24/2016   Vaginal discharge 06/24/2016   Labial lesion 06/24/2016   Sebaceous cyst 09/09/2015   Influenza vaccination declined by patient 05/10/2015   Threatened abortion in first trimester 02/13/2015   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 No pertinent surgical history.  OB History     Gravida  1   Para      Term      Preterm      AB  1   Living         SAB  1    IAB      Ectopic      Multiple      Live Births               Home Medications    Prior to Admission medications   Medication Sig Start Date End Date Taking? Authorizing Provider  albuterol  (VENTOLIN  HFA) 108 (90 Base) MCG/ACT inhaler Inhale 2 puffs into the lungs every 4 (four) hours as needed. 05/04/24  Yes Bernardino Ditch, NP  benzonatate  (TESSALON ) 100 MG capsule Take 2 capsules (200 mg total) by mouth every 8 (eight) hours. 05/04/24  Yes Bernardino Ditch, NP  hydrOXYzine  (VISTARIL ) 100 MG capsule Take 100 mg by mouth 2 (two) times daily. 11/03/23  Yes [provider]  ipratropium (ATROVENT ) 0.06 % nasal spray Place 2 sprays into both nostrils 4 (four) times daily. 05/04/24  Yes Bernardino Ditch, NP  promethazine -dextromethorphan (PROMETHAZINE -DM) 6.25-15 MG/5ML syrup Take 5 mLs by mouth 4 (four) times daily as needed. 05/04/24  Yes Bernardino Ditch, NP  REXULTI 1 MG TABS tablet Take 1 mg by mouth daily. 11/03/23  Yes [provider]  Spacer/Aero-Holding Chambers (AEROCHAMBER MV) inhaler Use as instructed 05/04/24  Yes Bernardino Ditch, NP    Family History Family History  Problem Relation Age of Onset   Alcohol abuse Mother    Depression Mother    Drug abuse Mother    Mental illness Mother  Alcohol abuse Father    Arthritis Father    Depression Father    Drug abuse Father    Hypertension Father    Hypertension Paternal Uncle    Alcohol abuse Maternal Grandmother    Depression Maternal Grandmother    Drug abuse Maternal Grandmother    Mental illness Maternal Grandmother    Alcohol abuse Maternal Grandfather    Drug abuse Maternal Grandfather    Arthritis Paternal Grandmother    Asthma Paternal Grandmother    Cancer Paternal Grandmother    COPD Paternal Grandmother    Hyperlipidemia Paternal Grandmother     Social History Social History   Tobacco Use   Smoking status: Former    Current packs/day: 0.00    Average packs/day: 1 pack/day for 2.0 years (2.0 ttl  pk-yrs)    Types: Cigarettes    Start date: 02/21/2014    Quit date: 02/22/2016    Years since quitting: 8.2   Smokeless tobacco: Never  Vaping Use   Vaping status: Never Used  Substance Use Topics   Alcohol use: No    Alcohol/week: 0.0 standard drinks of alcohol   Drug use: No     Allergies   Amoxicillin and Penicillins   Review of Systems Review of Systems  Constitutional:  Positive for fever.  HENT:  Positive for congestion and rhinorrhea. Negative for ear pain and sore throat.   Respiratory:  Positive for cough and shortness of breath. Negative for wheezing.   Gastrointestinal:  Positive for nausea.     Physical Exam Triage Vital Signs ED Triage Vitals  Encounter Vitals Group     BP      Girls Systolic BP Percentile      Girls Diastolic BP Percentile      Boys Systolic BP Percentile      Boys Diastolic BP Percentile      Pulse      Resp      Temp      Temp src      SpO2      Weight      Height      Head Circumference      Peak Flow      Pain Score      Pain Loc      Pain Education      Exclude from Growth Chart    No data found.  Updated Vital Signs BP 127/80 (BP Location: Left Arm)   Pulse 92   Temp 98.4 F (36.9 C) (Oral)   Resp 12   Wt 176 lb 12.8 oz (80.2 kg)   LMP 04/20/2024   SpO2 98%   BMI 30.35 kg/m   Visual Acuity Right Eye Distance:   Left Eye Distance:   Bilateral Distance:    Right Eye Near:   Left Eye Near:    Bilateral Near:     Physical Exam Vitals and nursing note reviewed.  Constitutional:      Appearance: Normal appearance. She is not ill-appearing.  HENT:     Head: Normocephalic and atraumatic.     Right Ear: Tympanic membrane, ear canal and external ear normal. There is no impacted cerumen.     Left Ear: Tympanic membrane, ear canal and external ear normal. There is no impacted cerumen.     Nose: Congestion and rhinorrhea present.     Comments: Nasal mucosa is erythematous, mildly edematous, with yellow discharge  in both nares.    Mouth/Throat:     Mouth: Mucous membranes  are moist.     Pharynx: Oropharynx is clear. Posterior oropharyngeal erythema present. No oropharyngeal exudate.     Comments: Posterior oropharynx demonstrates mild erythema with yellow postnasal drip. Neck:     Comments: Bilateral anterior, tender cervical lymphadenopathy present. Cardiovascular:     Rate and Rhythm: Normal rate and regular rhythm.     Pulses: Normal pulses.     Heart sounds: Normal heart sounds. No murmur heard.    No friction rub. No gallop.  Pulmonary:     Effort: Pulmonary effort is normal.     Breath sounds: Normal breath sounds. No wheezing, rhonchi or rales.  Musculoskeletal:     Cervical back: Normal range of motion and neck supple. Tenderness present.  Lymphadenopathy:     Cervical: Cervical adenopathy present.  Skin:    General: Skin is warm and dry.     Capillary Refill: Capillary refill takes less than 2 seconds.     Findings: No rash.  Neurological:     General: No focal deficit present.     Mental Status: She is alert and oriented to person, place, and time.      UC Treatments / Results  Labs (all labs ordered are listed, but only abnormal results are displayed) Labs Reviewed  SARS CORONAVIRUS 2 BY RT PCR    EKG   Radiology No results found.  Procedures Procedures (including critical care time)  Medications Ordered in UC Medications - No data to display  Initial Impression / Assessment and Plan / UC Course  I have reviewed the triage vital signs and the nursing notes.  Pertinent labs & imaging results that were available during my care of the patient were reviewed by me and considered in my medical decision making (see chart for details).   Patient is a pleasant, nontoxic-appearing 28 year old female presenting for evaluation of respiratory symptoms as outlined in HPI above.  She reports that her symptoms initially started out in her upper respiratory tract and now have  settled into her chest.  She endorses tightness in her chest and a feeling of burning.  She is coughing and reports that it takes her significant forceful coughing in order bring up any mucus.  She is endorsing shortness of breath but denies wheezing.  However, she is able to speak in full sentences without dyspnea or tachypnea.  Her respiratory rate at triage was counted as 12 and her oxygen saturation on room air was 98%.  She is currently afebrile with oral temp of 98.4.  Her lungs are clear to auscultation in all fields.  Differential diagnosis includes COVID, influenza, viral respiratory illness.  Due to the fact that she is on day 4 of symptoms I will not test her for influenza at this time but I will order a COVID PCR.  COVID PCR is negative.  I will discharge patient on the diagnosis of viral URI with a cough.  I will prescribe Atrovent  nasal spray to help with her nasal congestion along with Tessalon  Perles and Promethazine  DM cough syrup for cough and congestion.  Additionally, I will prescribe an albuterol  inhaler and a spacer and she can do 1 to 2 puffs every 4-6 hours as needed for any shortness of breath.   Final Clinical Impressions(s) / UC Diagnoses   Final diagnoses:  Viral URI with cough     Discharge Instructions      Your testing today for COVID was negative.  Your exam is consistent with an upper respiratory infection and it  is most likely viral given your short duration of symptoms.  Use over-the-counter Tylenol  and/or ibuprofen according the package instructions as needed for any fever or pain.  You may use the albuterol  inhaler, with the spacer, and take 1 to 2 puffs every 4-6 hours as needed for any shortness of breath or if you develop wheezing.  This should also help you with your chest tightness.Use the Atrovent  nasal spray, 2 squirts in each nostril every 6 hours, as needed for runny nose and postnasal drip.  Use the Tessalon  Perles every 8 hours during the day.   Take them with a small sip of water.  They may give you some numbness to the base of your tongue or a metallic taste in your mouth, this is normal.  Use the Promethazine  DM cough syrup at bedtime for cough and congestion.  It will make you drowsy so do not take it during the day.  Return for reevaluation or see your primary care provider for any new or worsening symptoms.      ED Prescriptions     Medication Sig Dispense Auth. Provider   Spacer/Aero-Holding Chambers (AEROCHAMBER MV) inhaler Use as instructed 1 each Bernardino Ditch, NP   albuterol  (VENTOLIN  HFA) 108 (90 Base) MCG/ACT inhaler Inhale 2 puffs into the lungs every 4 (four) hours as needed. 18 g Bernardino Ditch, NP   benzonatate  (TESSALON ) 100 MG capsule Take 2 capsules (200 mg total) by mouth every 8 (eight) hours. 21 capsule Bernardino Ditch, NP   ipratropium (ATROVENT ) 0.06 % nasal spray Place 2 sprays into both nostrils 4 (four) times daily. 15 mL Bernardino Ditch, NP   promethazine -dextromethorphan (PROMETHAZINE -DM) 6.25-15 MG/5ML syrup Take 5 mLs by mouth 4 (four) times daily as needed. 118 mL Bernardino Ditch, NP      PDMP not reviewed this encounter.   Bernardino Ditch, NP 05/04/24 1001

## 2024-05-25 ENCOUNTER — Ambulatory Visit: Admitting: Rheumatology

## 2024-06-12 ENCOUNTER — Other Ambulatory Visit: Payer: Self-pay | Admitting: Genetic Counselor

## 2024-06-12 DIAGNOSIS — Z006 Encounter for examination for normal comparison and control in clinical research program: Secondary | ICD-10-CM

## 2024-07-01 ENCOUNTER — Telehealth: Admitting: Physician Assistant

## 2024-07-01 DIAGNOSIS — R3989 Other symptoms and signs involving the genitourinary system: Secondary | ICD-10-CM | POA: Diagnosis not present

## 2024-07-01 MED ORDER — CEPHALEXIN 500 MG PO CAPS: 500.0000 mg | ORAL_CAPSULE | Freq: Two times a day (BID) | ORAL | 0 refills | Status: AC

## 2024-07-01 NOTE — Patient Instructions (Signed)
  Emma Edwards, thank you for joining [name], PA-C for today's virtual visit.  While this provider is not your primary care provider (PCP), if your PCP is located in our provider database this encounter information will be shared with them immediately following your visit.   A [facility] MyChart account gives you access to today's visit and all your visits, tests, and labs performed at [facility]  click here if you don't have a [facility] MyChart account or go to [email or web address]  Consent: (Patient) Emma Edwards provided verbal consent for this virtual visit at the beginning of the encounter.  Current Medications:  Current Outpatient Medications:    albuterol  (VENTOLIN  HFA) 108 (90 Base) MCG/ACT inhaler, Inhale 2 puffs into the lungs every 4 (four) hours as needed., Disp: 18 g, Rfl: 0   benzonatate  (TESSALON ) 100 MG capsule, Take 2 capsules (200 mg total) by mouth every 8 (eight) hours., Disp: 21 capsule, Rfl: 0   hydrOXYzine  (VISTARIL ) 100 MG capsule, Take 100 mg by mouth 2 (two) times daily., Disp: , Rfl:    ipratropium (ATROVENT ) 0.06 % nasal spray, Place 2 sprays into both nostrils 4 (four) times daily., Disp: 15 mL, Rfl: 12   promethazine -dextromethorphan (PROMETHAZINE -DM) 6.25-15 MG/5ML syrup, Take 5 mLs by mouth 4 (four) times daily as needed., Disp: 118 mL, Rfl: 0   REXULTI 1 MG TABS tablet, Take 1 mg by mouth daily., Disp: , Rfl:    Spacer/Aero-Holding Chambers (AEROCHAMBER MV) inhaler, Use as instructed, Disp: 1 each, Rfl: 2   Medications ordered in this encounter:  No orders of the defined types were placed in this encounter.    *If you need refills on other medications prior to your next appointment, please contact your pharmacy*  Follow-Up: Call back or seek an in-person evaluation if the symptoms worsen or if the condition fails to improve as anticipated.  [facility] [phone]  Other Instructions Follow up with  primary provider in 24-48 hours. Report to nearest ER with any worsening symptoms.    If you have been instructed to have an in-person evaluation today at a local Urgent Care facility, please use the link below. It will take you to a list of all of our available [facility], including address, phone number and hours of operation. Please do not delay care.  [facility]  If you or a family member do not have a primary care provider, use the link below to schedule a visit and establish care. When you choose a [facility] primary care physician or advanced practice provider, you gain a long-term partner in health. Find a Primary Care Provider  Learn more about [facility]'s in-office and virtual care options:  - Get Care Now

## 2024-07-01 NOTE — Progress Notes (Signed)
 Virtual Visit Consent   Emma Edwards, you are scheduled for a virtual visit with a [facility] provider today. Just as with appointments in the office, your consent must be obtained to participate. Your consent will be active for this visit and any virtual visit you may have with one of our providers in the next 365 days. If you have a MyChart account, a copy of this consent can be sent to you electronically.  As this is a virtual visit, video technology does not allow for your provider to perform a traditional examination. This may limit your provider's ability to fully assess your condition. If your provider identifies any concerns that need to be evaluated in person or the need to arrange testing (such as labs, EKG, etc.), we will make arrangements to do so. Although advances in technology are sophisticated, we cannot ensure that it will always work on either your end or our end. If the connection with a video visit is poor, the visit may have to be switched to a telephone visit. With either a video or telephone visit, we are not always able to ensure that we have a secure connection.  By engaging in this virtual visit, you consent to the provision of healthcare and authorize for your insurance to be billed (if applicable) for the services provided during this visit. Depending on your insurance coverage, you may receive a charge related to this service.  I need to obtain your verbal consent now. Are you willing to proceed with your visit today? Emma Edwards has provided verbal consent on 07/01/2024 for a virtual visit (video or telephone). [name], [place]  Date: 07/01/2024 11:33 AM   Virtual Visit via Video Note   I, [name], connected with  Emma Edwards  ([id number], 1996/06/26) on 07/01/24 at 11:30 AM EST by a video-enabled telemedicine application and verified that I am speaking with the correct person using two identifiers.  Location: Patient: Virtual Visit Location  Patient: Home Provider: Virtual Visit Location Provider: Home Office   I discussed the limitations of evaluation and management by telemedicine and the availability of in person appointments. The patient expressed understanding and agreed to proceed.    History of Present Illness: Emma Edwards is a 28 y.o. who identifies as a female who was assigned female at birth, and is being seen today for UTI.  HPI: Urinary Tract Infection  This is a new problem. The current episode started today. The problem occurs every urination. The problem has been unchanged. The quality of the pain is described as aching and burning. There has been no fever. She is Not sexually active. There is No history of pyelonephritis. Associated symptoms include frequency, hesitancy and urgency. Pertinent negatives include no discharge. She has tried nothing for the symptoms. The treatment provided no relief.    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, Disp: 21 capsule, Rfl: 0   hydrOXYzine  (VISTARIL ) 100 MG capsule, Take 100 mg by mouth 2 (two) times daily., Disp: , Rfl:    ipratropium (ATROVENT ) 0.06 % nasal spray, Place 2 sprays into both nostrils 4 (four) times daily., Disp: 15 mL, Rfl: 12   promethazine -dextromethorphan (PROMETHAZINE -DM) 6.25-15 MG/5ML syrup, Take 5 mLs by mouth 4 (four) times daily as needed., Disp: 118 mL, Rfl: 0   REXULTI 1 MG TABS tablet, Take 1 mg by mouth daily., Disp: , Rfl:    Spacer/Aero-Holding Chambers (AEROCHAMBER MV) inhaler, Use as instructed, Disp: 1 each, Rfl: 2  Observations/Objective: Patient is well-developed, well-nourished in no acute distress.  Resting comfortably  at home.  Head is normocephalic, atraumatic.  No labored breathing.  Speech is clear and coherent with logical content.  Patient is alert and oriented at baseline.    Assessment and Plan: 1. Suspected UTI (Primary)  Patient presenting with urinary urgency and frequency, I suspect acute UTI.    I also considered PID, pregnancy, ectopic pregnancy, endometriosis, yeast vaginitis,  tubovarian abscess, appendicitis, BV ,  and pyelonephritis, but this appears less likely considering the data gathered thus far.  Medication prescribed. I have instructed the patient to present to the nearest ER at any time if there are any new or worsening symptoms.  The patient expressed understanding of and agreement with this plan.  Opportunity was given for questions prior to discharge and all stated questions were answered to the patient's satisfaction.    Follow Up Instructions: I discussed the assessment and treatment plan with the patient. The patient was provided an opportunity to ask questions and all were answered. The patient agreed with the plan and demonstrated an  understanding of the instructions.  A copy of instructions were sent to the patient via MyChart unless otherwise noted below.    The patient was advised to call back or seek an in-person evaluation if the symptoms worsen or if the condition fails to improve as anticipated.    [name], PA-C

## 2024-07-10 NOTE — Progress Notes (Deleted)
 Name: Emma Edwards   MRN: [id number]    DOB: 09/14/1995   Date:07/10/2024       Progress Note  Subjective  Chief Complaint  No chief complaint on file.   HPI  Patient presents for annual CPE.  Diet: *** Exercise: ***  Last Eye Exam: {HM options:31805} Last Dental Exam: {HM options:31805}  Flowsheet Row Office Visit from 04/19/2024 in [facility]  AUDIT-C Score 1    Depression: Phq 9 is  {Desc; negative/positive:13464}    04/19/2024    1:02 PM 11/12/2023   10:31 AM 07/14/2023    9:38 AM 06/29/2023    9:13 AM 06/17/2023   11:25 AM  Depression screen PHQ 2/9  Decreased Interest 0 1 2 1 1   Down, Depressed, Hopeless 0 0 2 1 2   PHQ - 2 Score 0 1 4 2 3   Altered sleeping 0 0 3 1 2   Tired, decreased energy 3 1 2 1 3   Change in appetite 0 0 0 1 1  Feeling bad or failure about yourself  0 0 1 0 1  Trouble concentrating 0 1 0 1 3  Moving slowly or fidgety/restless 0 0 0 0 0  Suicidal thoughts 0 0 1 0 0  PHQ-9 Score 3  3  11  6  13    Difficult doing work/chores  Somewhat difficult Very difficult Very difficult Very difficult     Data saved with a previous flowsheet row definition   Hypertension: BP Readings from Last 3 Encounters:  05/04/24 127/80  04/19/24 118/72  12/21/23 124/72   Obesity: Wt Readings from Last 3 Encounters:  05/04/24 176 lb 12.8 oz (80.2 kg)  04/19/24 179 lb (81.2 kg)  12/21/23 163 lb (73.9 kg)   BMI Readings from Last 3 Encounters:  05/04/24 30.35 kg/m  04/19/24 30.73 kg/m  12/21/23 27.98 kg/m     Vaccines: reviewed with the patient.   Hep C Screening: completed STD testing and prevention (HIV/chl/gon/syphilis): completed 12/21/23 Intimate partner violence: negative screen  Sexual History :active Menstrual History/LMP/Abnormal Bleeding:  Discussed importance of follow up if any post-menopausal bleeding: not applicable  Incontinence Symptoms: negative for symptoms   Breast cancer:  - Last Mammogram: NA -  BRCA gene screening:   Osteoporosis Prevention : Discussed high calcium and vitamin D supplementation, weight bearing exercises Bone density :not applicable   Cervical cancer screening: up-to-date  Skin cancer: Discussed monitoring for atypical lesions  Colorectal cancer: NA   Lung cancer:  Low Dose CT Chest recommended if Age 46-80 years, 20 pack-year currently smoking OR have quit w/in 15years. Patient does not qualify for screen   ECG: NA  Advanced Care Planning: A voluntary discussion about advance care planning including the explanation and discussion of advance directives.  Discussed health care proxy and Living will, and the patient was able to identify a health care proxy as ***.  Patient {DOES_DOES WNU:81435} have a living will and power of attorney of health care   Patient Active Problem List   Diagnosis Date Noted   Moderate episode of recurrent major depressive disorder (HCC) 07/14/2023   Insomnia due to other mental disorder 07/14/2023   Controlled substance agreement signed 07/14/2023   Anxiety 06/29/2023   Unspecified mood (affective) disorder 06/17/2023   Screen for STD (sexually transmitted disease) 06/24/2016   Vaginal discharge 06/24/2016   Labial lesion 06/24/2016   Sebaceous cyst 09/09/2015   Influenza vaccination declined by patient 05/10/2015   Threatened abortion in first trimester 02/13/2015  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  Family History  Problem Relation Age of Onset   Alcohol abuse Mother    Depression Mother    Drug abuse Mother    Mental illness Mother    Alcohol abuse Father    Arthritis Father    Depression Father    Drug abuse Father    Hypertension Father    Hypertension Paternal  Uncle    Alcohol abuse Maternal Grandmother    Depression Maternal Grandmother    Drug abuse Maternal Grandmother    Mental illness Maternal Grandmother    Alcohol abuse Maternal Grandfather    Drug abuse Maternal Grandfather    Arthritis Paternal Grandmother    Asthma Paternal Grandmother    Cancer Paternal Grandmother    COPD Paternal Grandmother    Hyperlipidemia Paternal Grandmother     Social History   Socioeconomic History   Marital status: Single    Spouse name: Not on file   Number of children: 0   Years of education: Not on file   Highest education level: GED or equivalent  Occupational History   Not on file  Tobacco Use   Smoking status: Former    Current packs/day: 0.00    Average packs/day: 1 pack/day for 2.0 years (2.0 ttl pk-yrs)    Types: Cigarettes    Start date: 02/21/2014    Quit date: 02/22/2016    Years since quitting: 8.3   Smokeless tobacco: Never  Vaping Use   Vaping status: Never Used  Substance and Sexual Activity   Alcohol use: No    Alcohol/week: 0.0 standard drinks of alcohol   Drug use: No   Sexual activity: Yes    Partners: Male    Birth control/protection: Coitus interruptus  Other Topics Concern   Not on file  Social History Narrative   Not on file   Social Drivers of Health   Financial Resource Strain: Low Risk  (04/19/2024)   Overall Financial Resource Strain (CARDIA)    Difficulty of Paying Living Expenses: Not very hard  Food Insecurity: Food Insecurity Present (04/19/2024)   Hunger Vital Sign    Worried About Running Out of Food in the Last Year: Sometimes true    Ran Out of Food in the Last Year: Never true  Transportation Needs: No Transportation Needs (04/19/2024)   PRAPARE - Administrator, Civil Service (Medical): No    Lack of Transportation (Non-Medical): No  Physical Activity: Insufficiently Active (04/19/2024)   Exercise Vital Sign    Days of Exercise per Week: 1 day    Minutes of Exercise per Session: 10  min  Stress: No Stress Concern Present (04/19/2024)   [employer] of Occupational Health - Occupational Stress Questionnaire    Feeling of Stress: Only a little  Social Connections: Moderately Isolated (04/19/2024)   Social Connection and Isolation Panel    Frequency of Communication with Friends and Family: Twice a week    Frequency of Social Gatherings with Friends and Family: Once a week    Attends Religious Services: Never    Database Administrator or Organizations: No    Attends Engineer, Structural: Not on file    Marital Status: Living with partner  Intimate Partner Violence:  Not At Risk (09/12/2018)   Humiliation, Afraid, Rape, and Kick questionnaire    Fear of Current or Ex-Partner: No    Emotionally Abused: No    Physically Abused: No    Sexually Abused: No     Current Outpatient Medications:    albuterol  (VENTOLIN  HFA) 108 (90 Base) MCG/ACT inhaler, Inhale 2 puffs into the lungs every 4 (four) hours as needed., Disp: 18 g, Rfl: 0   benzonatate  (TESSALON ) 100 MG capsule, Take 2 capsules (200 mg total) by mouth every 8 (eight) hours., Disp: 21 capsule, Rfl: 0   hydrOXYzine  (VISTARIL ) 100 MG capsule, Take 100 mg by mouth 2 (two) times daily., Disp: , Rfl:    ipratropium (ATROVENT ) 0.06 % nasal spray, Place 2 sprays into both nostrils 4 (four) times daily., Disp: 15 mL, Rfl: 12   promethazine -dextromethorphan (PROMETHAZINE -DM) 6.25-15 MG/5ML syrup, Take 5 mLs by mouth 4 (four) times daily as needed., Disp: 118 mL, Rfl: 0   REXULTI 1 MG TABS tablet, Take 1 mg by mouth daily., Disp: , Rfl:    Spacer/Aero-Holding Chambers (AEROCHAMBER MV) inhaler, Use as instructed, Disp: 1 each, Rfl: 2  Allergies  Allergen Reactions   Amoxicillin Hives   Penicillins Hives     ROS  ***  Objective  There were no vitals filed for this visit.  There is no height or weight on file to calculate BMI.  Physical Exam ***  {Show previous labs (optional):23779}  Assessment &  Plan  There are no diagnoses linked to this encounter.  -USPSTF grade A and B recommendations reviewed with patient; age-appropriate recommendations, preventive care, screening tests, etc discussed and encouraged; healthy living encouraged; see AVS for patient education given to patient -Discussed importance of 150 minutes of physical activity weekly, eat two servings of fish weekly, eat one serving of tree nuts ( cashews, pistachios, pecans, almonds.SABRA) every other day, eat 6 servings of fruit/vegetables daily and drink plenty of water and avoid sweet beverages.   -Reviewed Health Maintenance: {yes wn:685467}

## 2024-07-11 ENCOUNTER — Encounter: Admitting: Internal Medicine

## 2024-07-24 ENCOUNTER — Ambulatory Visit: Admitting: Family Medicine

## 2024-07-24 ENCOUNTER — Encounter: Payer: Self-pay | Admitting: Family Medicine

## 2024-07-24 VITALS — BP 120/72 | HR 81 | Resp 16 | Ht 64.0 in | Wt 181.0 lb

## 2024-07-24 DIAGNOSIS — F331 Major depressive disorder, recurrent, moderate: Secondary | ICD-10-CM | POA: Diagnosis not present

## 2024-07-24 DIAGNOSIS — F419 Anxiety disorder, unspecified: Secondary | ICD-10-CM

## 2024-07-24 DIAGNOSIS — N926 Irregular menstruation, unspecified: Secondary | ICD-10-CM | POA: Diagnosis not present

## 2024-07-24 DIAGNOSIS — R5383 Other fatigue: Secondary | ICD-10-CM

## 2024-07-24 DIAGNOSIS — D509 Iron deficiency anemia, unspecified: Secondary | ICD-10-CM

## 2024-07-24 LAB — COMPREHENSIVE METABOLIC PANEL WITH GFR
AG Ratio: 1.5 (calc) (ref 1.0–2.5)
ALT: 11 U/L (ref 6–29)
AST: 12 U/L (ref 10–30)
Albumin: 4.3 g/dL (ref 3.6–5.1)
Alkaline phosphatase (APISO): 64 U/L (ref 31–125)
BUN: 12 mg/dL (ref 7–25)
CO2: 26 mmol/L (ref 20–32)
Calcium: 9.5 mg/dL (ref 8.6–10.2)
Chloride: 102 mmol/L (ref 98–110)
Creat: 0.68 mg/dL (ref 0.50–0.96)
Globulin: 2.8 g/dL (ref 1.9–3.7)
Glucose, Bld: 87 mg/dL (ref 65–99)
Potassium: 4.6 mmol/L (ref 3.5–5.3)
Sodium: 136 mmol/L (ref 135–146)
Total Bilirubin: 0.3 mg/dL (ref 0.2–1.2)
Total Protein: 7.1 g/dL (ref 6.1–8.1)
eGFR: 121 mL/min/1.73m2 (ref >=60)

## 2024-07-24 LAB — IRON,TIBC AND FERRITIN PANEL
%SAT: 37 % (ref 16–45)
Ferritin: 27 ng/mL (ref 16–154)
Iron: 89 ug/dL (ref 40–190)
TIBC: 238 ug/dL — ABNORMAL LOW (ref 250–450)

## 2024-07-24 LAB — CBC WITH DIFFERENTIAL/PLATELET
Absolute Lymphocytes: 2563 {cells}/uL (ref 850–3900)
Absolute Monocytes: 504 {cells}/uL (ref 200–950)
Basophils Absolute: 43 {cells}/uL (ref 0–200)
Basophils Relative: 0.6 %
Eosinophils Absolute: 92 {cells}/uL (ref 15–500)
Eosinophils Relative: 1.3 %
HCT: 41.1 % (ref 35.9–46.0)
Hemoglobin: 13.9 g/dL (ref 11.7–15.5)
MCH: 31.6 pg (ref 27.0–33.0)
MCHC: 33.8 g/dL (ref 31.6–35.4)
MCV: 93.4 fL (ref 81.4–101.7)
MPV: 14.6 fL — ABNORMAL HIGH (ref 7.5–12.5)
Monocytes Relative: 7.1 %
Neutro Abs: 3898 {cells}/uL (ref 1500–7800)
Neutrophils Relative %: 54.9 %
Platelets: 190 Thousand/uL (ref 140–400)
RBC: 4.4 Million/uL (ref 3.80–5.10)
RDW: 12.5 % (ref 11.0–15.0)
Total Lymphocyte: 36.1 %
WBC: 7.1 Thousand/uL (ref 3.8–10.8)

## 2024-07-24 LAB — HCG, QUANTITATIVE, PREGNANCY: HCG, Total, QN: <5 m[IU]/mL

## 2024-07-24 LAB — POCT URINE PREGNANCY: Preg Test, Ur: NEGATIVE

## 2024-07-24 LAB — TSH: TSH: 1.42 m[IU]/L

## 2024-07-24 NOTE — Addendum Note (Signed)
 Addended by: EDITH GRATE on: 07/24/2024 02:55 PM   Modules accepted: Orders

## 2024-07-24 NOTE — Progress Notes (Signed)
 Acute Office Visit  Subjective:     Patient ID: Emma Edwards, female    DOB: 03/22/1996, 28 y.o.   MRN: [id number]  Chief Complaint  Patient presents with   Menstrual Problem    Period late x2 week, increased hunger, increased fatigue    HPI Patient is in today for concerns of late menstrual period. She voices her LMP was 06/14/24. She voices her cycle is typically every 28 to 30 days consistently and she does not typically experience irregular periods. She voices she is now two weeks late for her menstrual period. She reports she took several home pregnancy tests starting approximately 10 days ago and all were negative. Urine pregnancy test in office is also negative. She also voices complaints of difficulty with sleep stating she is having a harder time falling asleep. Increase fatigue and also increase in appetite. She is sexually active with one partner. No protection.    Will note that she discontinued both Hydroxyzine  and Rexulti abruptly 10 days ago after concern of potential pregnancy. She follows with psychiatry who prescribes these medications. She reports she contacted psychiatry regarding discontinuation of medication and that she does not desire to restart medication as she is TTC now. Advised she continue following with psychiatry for management of anxiety and depression.  We discussed that insomnia and other changes in sleep could be s/t abrupt discontinuation of psychiatric medications. She is receptive. She states that she assumed sleep changes were also related to stopping medication.    Hx of iron deficiency. Most recent iron panel in April 2025 demonstrated iron deficiency with low ferritin of 11. She voices the last time she has taken oral iron supplement was approximately one year ago.  Last iron panel in April 2025 abnormal. Will repeat today.  Review of Systems  Constitutional:  Positive for malaise/fatigue.  Psychiatric/Behavioral:  The patient has insomnia.          Objective:    BP 120/72   Pulse 81   Resp 16   Ht 5' 4 (1.626 m)   Wt 181 lb (82.1 kg)   LMP 06/14/2024   SpO2 97%   BMI 31.07 kg/m  BP Readings from Last 3 Encounters:  07/24/24 120/72  05/04/24 127/80  04/19/24 118/72    Iron/TIBC/Ferritin/ %Sat    Component Value Date/Time   IRON 96 12/21/2023 1337   IRON 213 10/26/2023 0000   TIBC 251 12/21/2023 1337   TIBC 278 10/26/2023 0000   FERRITIN 11 (L) 12/21/2023 1337   IRONPCTSAT 38 12/21/2023 1337    CMP     Component Value Date/Time   NA 141 10/26/2023 0000   K 4.4 10/26/2023 0000   CL 103 10/26/2023 0000   CO2 25 (A) 10/26/2023 0000   GLUCOSE 89 06/14/2023 1341   BUN 18 10/26/2023 0000   CREATININE 0.8 10/26/2023 0000   CREATININE 0.67 06/14/2023 1341   CALCIUM 9.3 10/26/2023 0000   PROT 6.9 06/14/2023 1341   ALBUMIN 4.4 10/26/2023 0000   AST 9 (A) 10/26/2023 0000   ALT 13 10/26/2023 0000   ALKPHOS 85 10/26/2023 0000   BILITOT 0.4 06/14/2023 1341   EGFR 97.0 10/27/2023 1358   EGFR 97 10/26/2023 0000   CBC    Component Value Date/Time   WBC 6.9 12/21/2023 1337   RBC 4.44 12/21/2023 1337   HGB 13.9 12/21/2023 1337   HCT 41.4 12/21/2023 1337   PLT 184 12/21/2023 1337   MCV 93.2 12/21/2023 1337   MCH  31.3 12/21/2023 1337   MCHC 33.6 12/21/2023 1337   RDW 12.6 12/21/2023 1337   LYMPHSABS 4.7 (H) 02/13/2015 0028   MONOABS 0.7 02/13/2015 0028   EOSABS 62 12/21/2023 1337   BASOSABS 41 12/21/2023 1337     Physical Exam Constitutional:      Appearance: Normal appearance.  HENT:     Head: Normocephalic and atraumatic.  Cardiovascular:     Rate and Rhythm: Normal rate and regular rhythm.  Pulmonary:     Effort: Pulmonary effort is normal.     Breath sounds: Normal breath sounds.  Skin:    General: Skin is warm and dry.  Neurological:     General: No focal deficit present.     Mental Status: She is alert. Mental status is at baseline.  Psychiatric:        Mood and Affect: Mood normal.         Behavior: Behavior normal.     Results for orders placed or performed in visit on 07/24/24  POCT urine pregnancy  Result Value Ref Range   Preg Test, Ur Negative Negative        Assessment & Plan:   1. Late menstruation (Primary) Patient reports menstrual periods are typically regular occurring every 28 to 30 days. She states her menstrual period is now two weeks late. She has taken several home pregnancy tests all negative. - POCT urine pregnancy negative. Will proceed with serum HCG . - Ambulatory referral to Obstetrics / Gynecology - TSH - hCG, quantitative, pregnancy  2. Fatigue, unspecified type She reports increased fatigue over the last two weeks. She also reports change in sleep where she is having difficulty falling asleep. Fatigue likely worsened by insomnia. Will note iron deficiency diagnosis as well, see below. - CBC w/Diff/Platelet - Comprehensive Metabolic Panel (CMET) - TSH  3. Iron deficiency anemia, unspecified iron deficiency anemia type Last iron panel in April 2025 abnormal. Patient reports she has not taken an iron supplement in the last one year. - CBC w/Diff/Platelet - Fe+TIBC+Fer  4. Anxiety Anxiety managed by psychiatry. -Advised she continue following with psychiatry.  - TSH  5. Moderate episode of recurrent major depressive disorder (HCC) -Continue following with psychiatry.     Return in about 3 months (around 10/22/2024).  LAYMON LOISE CORE, FNP

## 2024-07-25 ENCOUNTER — Ambulatory Visit: Payer: Self-pay | Admitting: Family Medicine

## 2024-07-25 ENCOUNTER — Ambulatory Visit: Admitting: Physician Assistant
